# Patient Record
Sex: Female | Born: 1960 | Race: White | Hispanic: No | Marital: Single | Smoking: Never smoker
Health system: Southern US, Community
[De-identification: ages and names within clinical notes are randomized; demographics above are authoritative.]

## PROBLEM LIST (undated history)

## (undated) HISTORY — PX: LOBECTOMY: SHX5089

---

## 1998-10-11 ENCOUNTER — Other Ambulatory Visit: Admission: RE | Admit: 1998-10-11 | Discharge: 1998-10-11 | Payer: Self-pay | Admitting: Obstetrics and Gynecology

## 1998-11-22 ENCOUNTER — Other Ambulatory Visit: Admission: RE | Admit: 1998-11-22 | Discharge: 1998-11-22 | Payer: Self-pay | Admitting: Obstetrics and Gynecology

## 1998-12-26 ENCOUNTER — Other Ambulatory Visit: Admission: RE | Admit: 1998-12-26 | Discharge: 1998-12-26 | Payer: Self-pay | Admitting: Obstetrics and Gynecology

## 1998-12-26 ENCOUNTER — Encounter (INDEPENDENT_AMBULATORY_CARE_PROVIDER_SITE_OTHER): Payer: Self-pay | Admitting: Specialist

## 1999-06-15 ENCOUNTER — Other Ambulatory Visit: Admission: RE | Admit: 1999-06-15 | Discharge: 1999-06-15 | Payer: Self-pay | Admitting: Obstetrics and Gynecology

## 1999-12-11 ENCOUNTER — Ambulatory Visit (HOSPITAL_BASED_OUTPATIENT_CLINIC_OR_DEPARTMENT_OTHER): Admission: RE | Admit: 1999-12-11 | Discharge: 1999-12-11 | Payer: Self-pay | Admitting: Orthopedic Surgery

## 2005-10-21 ENCOUNTER — Other Ambulatory Visit: Admission: RE | Admit: 2005-10-21 | Discharge: 2005-10-21 | Payer: Self-pay | Admitting: Family Medicine

## 2005-10-29 ENCOUNTER — Encounter: Admission: RE | Admit: 2005-10-29 | Discharge: 2005-10-29 | Payer: Self-pay | Admitting: Family Medicine

## 2005-11-12 ENCOUNTER — Encounter: Admission: RE | Admit: 2005-11-12 | Discharge: 2005-11-12 | Payer: Self-pay | Admitting: Family Medicine

## 2006-07-21 ENCOUNTER — Ambulatory Visit (HOSPITAL_COMMUNITY): Admission: RE | Admit: 2006-07-21 | Discharge: 2006-07-21 | Payer: Self-pay | Admitting: Thoracic Surgery

## 2006-08-19 ENCOUNTER — Ambulatory Visit: Payer: Self-pay | Admitting: Thoracic Surgery

## 2006-08-19 ENCOUNTER — Inpatient Hospital Stay (HOSPITAL_COMMUNITY): Admission: RE | Admit: 2006-08-19 | Discharge: 2006-08-22 | Payer: Self-pay | Admitting: Thoracic Surgery

## 2006-08-19 ENCOUNTER — Encounter (INDEPENDENT_AMBULATORY_CARE_PROVIDER_SITE_OTHER): Payer: Self-pay | Admitting: *Deleted

## 2006-08-27 ENCOUNTER — Encounter: Admission: RE | Admit: 2006-08-27 | Discharge: 2006-08-27 | Payer: Self-pay | Admitting: Thoracic Surgery

## 2006-09-16 ENCOUNTER — Ambulatory Visit: Payer: Self-pay | Admitting: Thoracic Surgery

## 2006-09-16 ENCOUNTER — Encounter: Admission: RE | Admit: 2006-09-16 | Discharge: 2006-09-16 | Payer: Self-pay | Admitting: Thoracic Surgery

## 2007-07-25 IMAGING — CR DG CHEST 2V
2 series · 2 of 2 positions shown · non-contrast
Comparison: None.

CLINICAL DATA: Preadmit ? lung mass.
 CHEST ? 2 VIEW:

[view not recorded (1 of 2)]
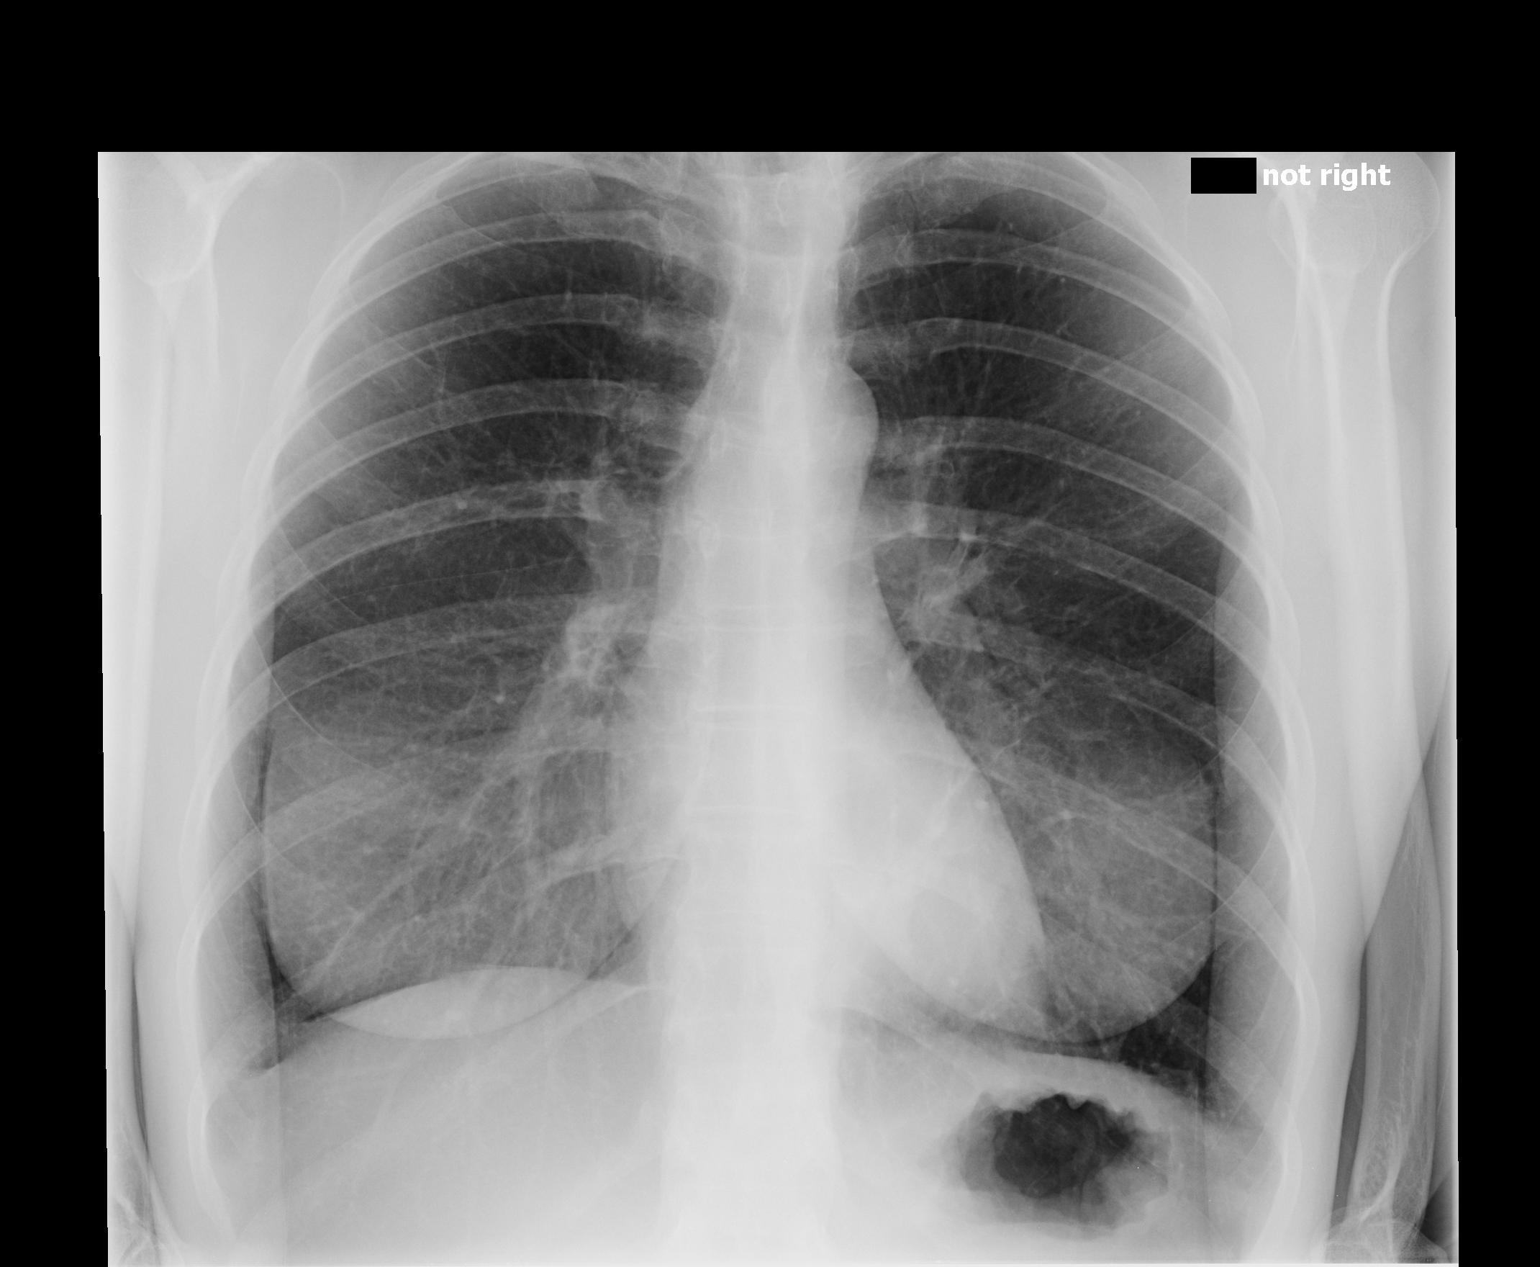

[view not recorded (2 of 2)]
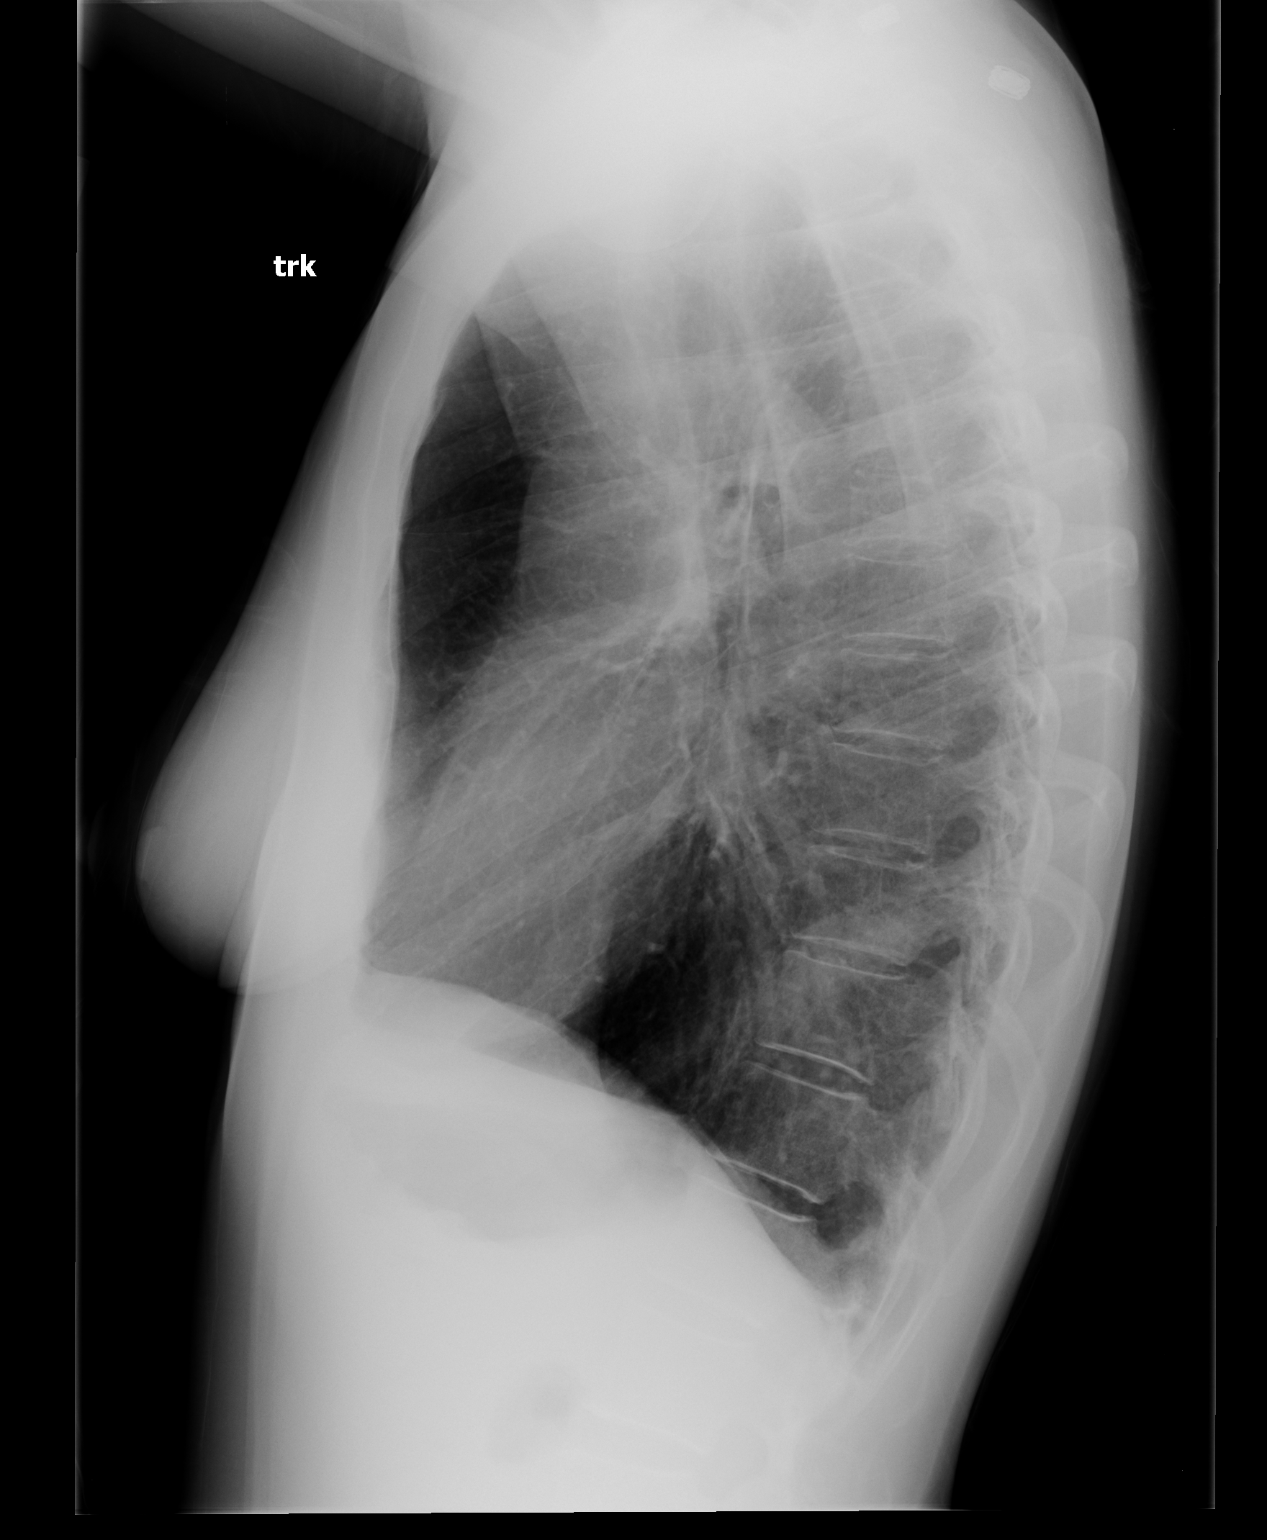

[2 of 2 positions shown; findings below may reference images not displayed]

FINDINGS: Heart size is normal.  The vascularity is normal.  There is no infiltrate or effusion.  Left lower lobe density adjacent to the spine is difficult to see, but is identified on recent PET CT scan.
IMPRESSION: Left lower lobe mass density.  No acute abnormality.

## 2007-08-26 IMAGING — CR DG CHEST 2V
2 series · 2 of 2 positions shown · non-contrast
Comparison: 08/27/06.

CLINICAL DATA: Lung lesion.  History of lung surgery, benign.  Pain on left. 
 CHEST - TWO VIEWS:

[view not recorded (1 of 2)]
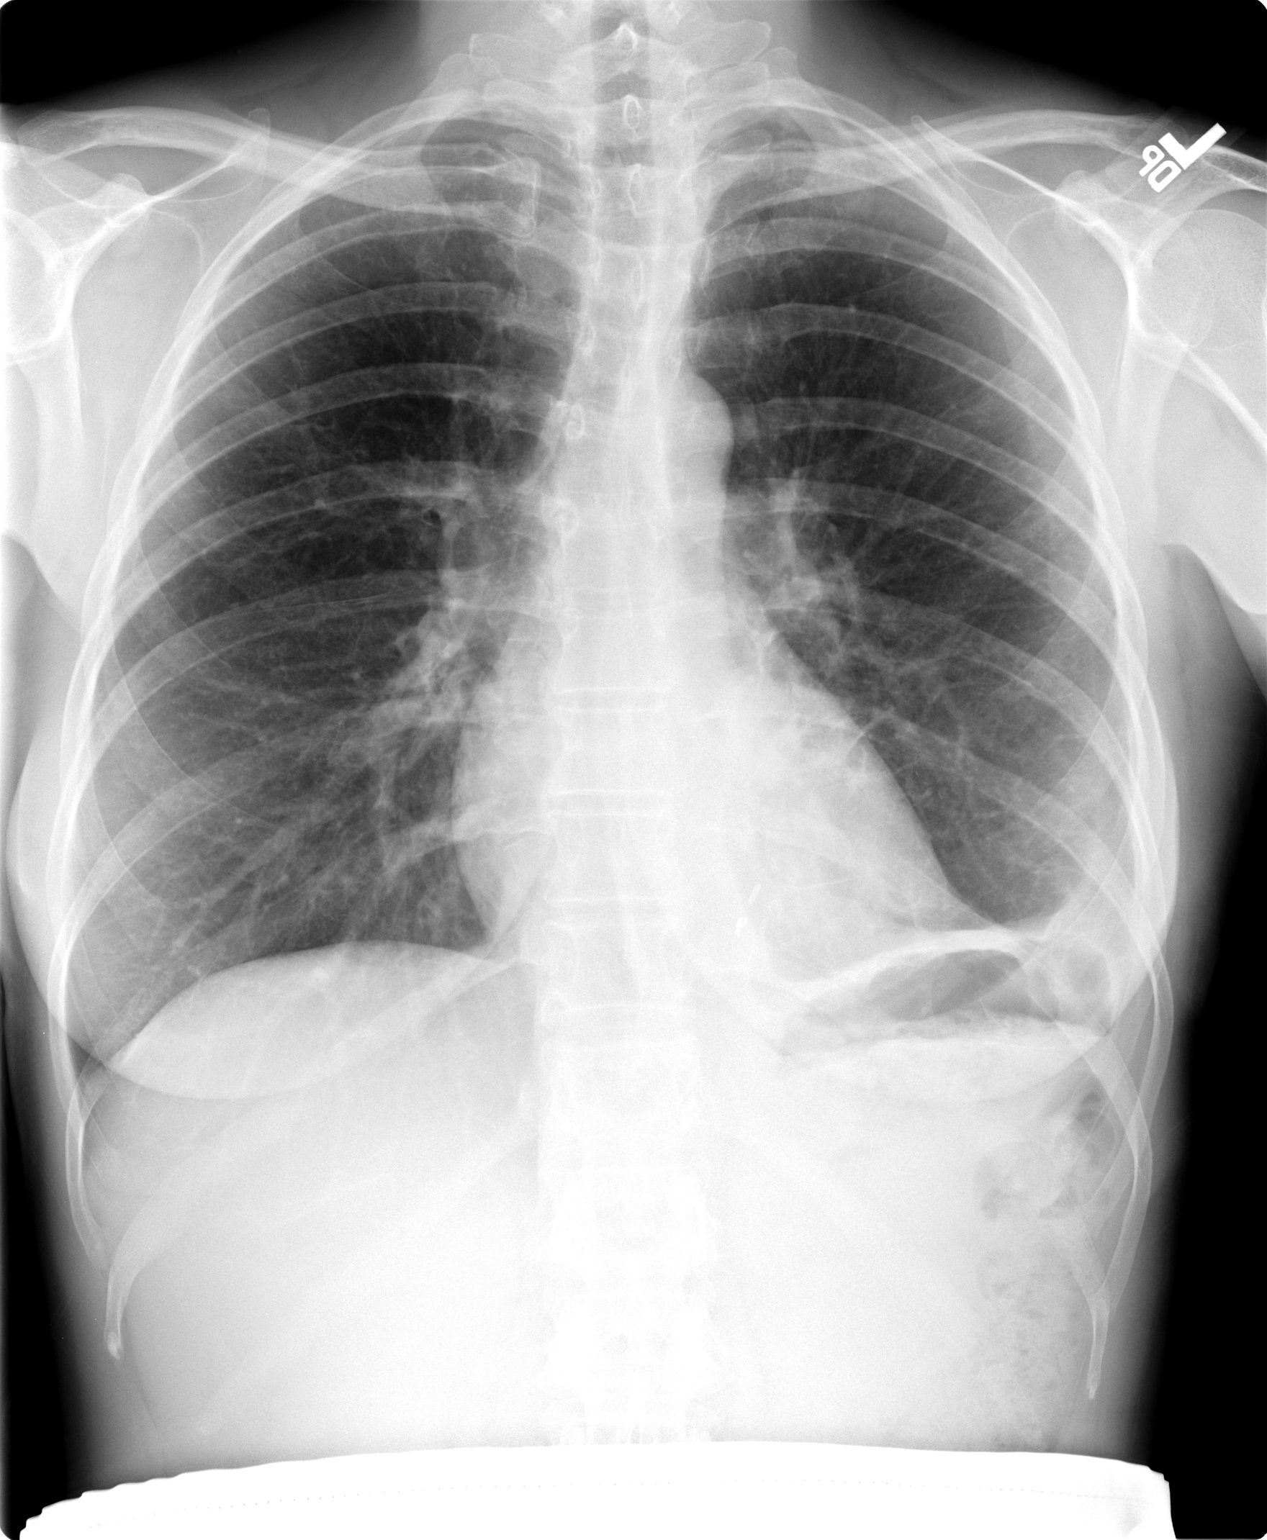

[view not recorded (2 of 2)]
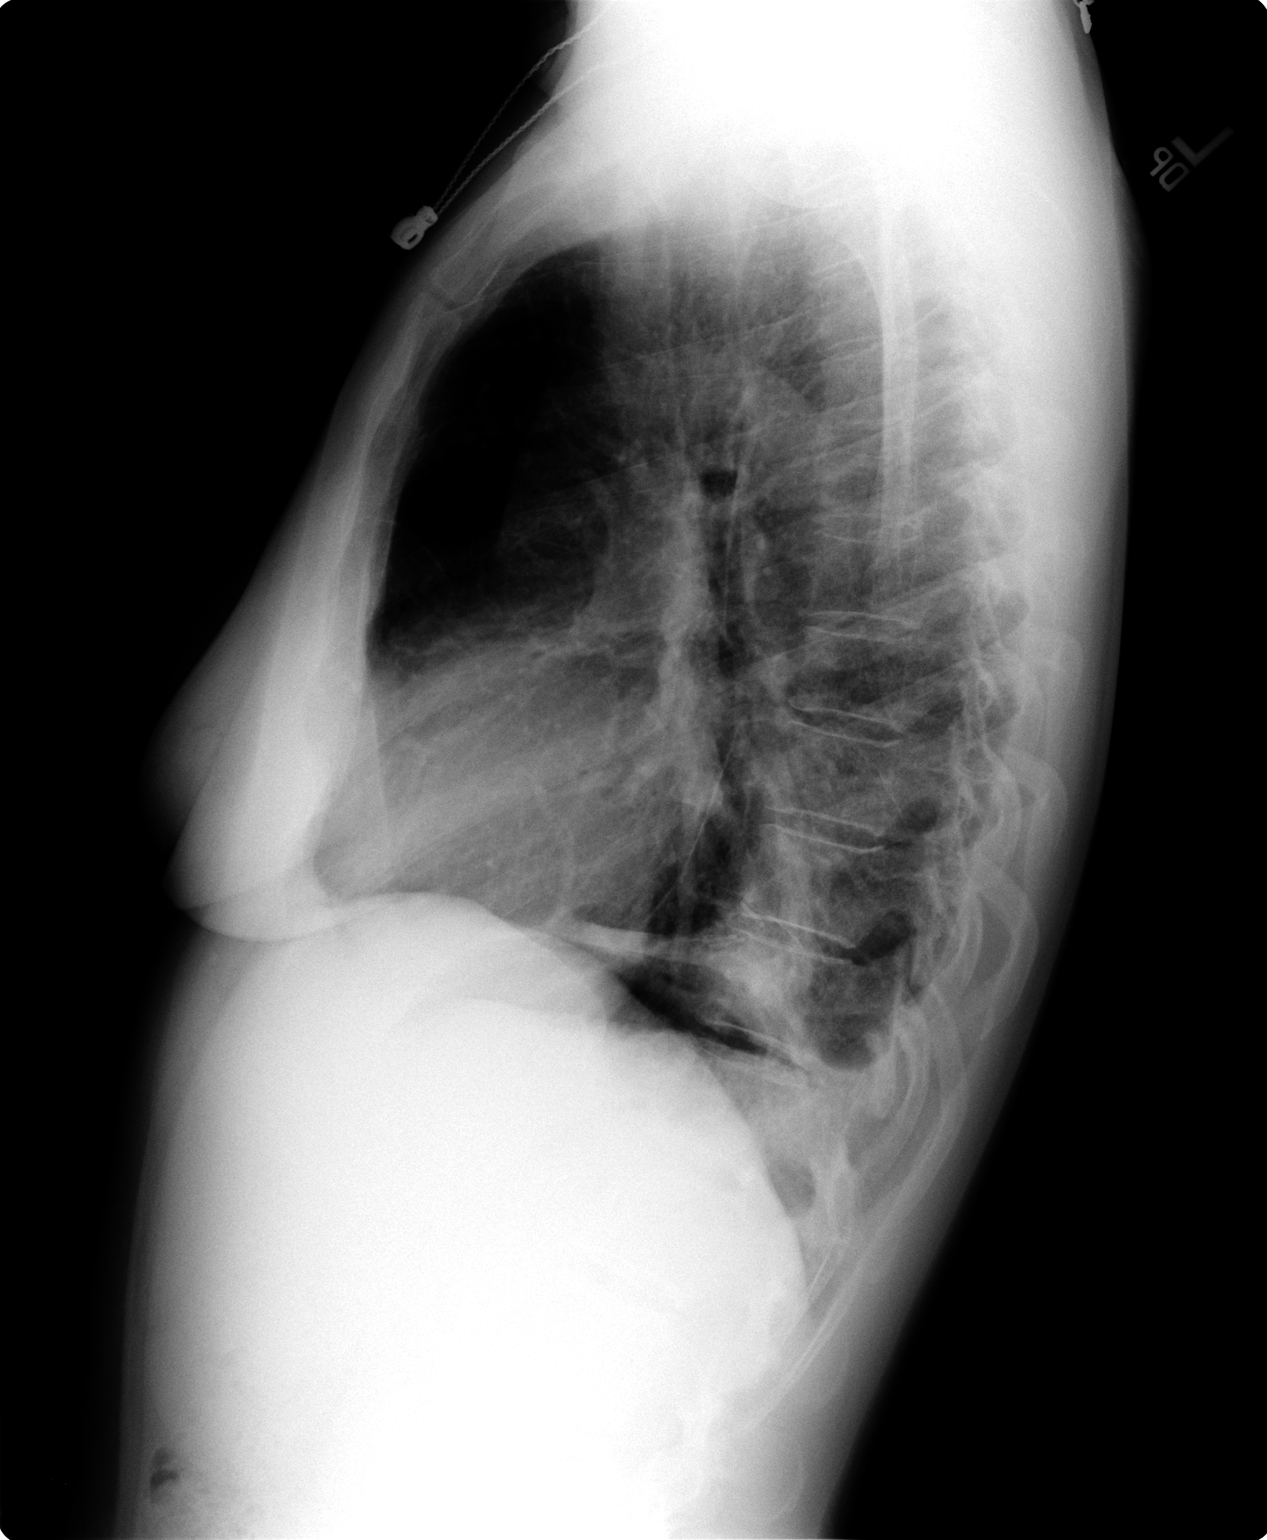

[2 of 2 positions shown; findings below may reference images not displayed]

FINDINGS: Surgical clips are seen at the left lung base.  Left pneumothorax is no longer apparent.  There is left base scar versus atelectasis which appears stable. The right lung is clear.
IMPRESSION: 1.  Postoperative change.
 2.  No evidence for pneumothorax.

## 2010-03-06 ENCOUNTER — Encounter: Admission: RE | Admit: 2010-03-06 | Discharge: 2010-03-06 | Payer: Self-pay | Admitting: Family Medicine

## 2010-07-29 ENCOUNTER — Encounter: Payer: Self-pay | Admitting: Family Medicine

## 2010-07-29 ENCOUNTER — Encounter: Payer: Self-pay | Admitting: Thoracic Surgery

## 2010-11-23 NOTE — H&P (Signed)
Whitney [name], [name] NO.:  [id number]   MEDICAL RECORD NO.:  [id number]          PATIENT TYPE:  INP   LOCATION:  NA                           FACILITY:  [facility]   PHYSICIAN:  Ines Bloomer, M.D. DATE OF BIRTH:  1960-08-08   DATE OF ADMISSION:  DATE OF DISCHARGE:                              HISTORY & PHYSICAL   HISTORY OF PRESENT ILLNESS:  This 50 year old patient had a recent chest  x-ray.  She is a chronic smoker, smoking 1 pack a day and was found to  have a left lower lobe nodule.  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  There  is no history of fever, chills, hemoptysis, or excessive sputum.  Pulmonary function tests showed an FVC of 4.25 with FV1 of 3.29.  PET  scan was done which was negative.   MEDICATIONS:  She is on Adderall 30 mg a day and Lexapro 20 mg a day.   ALLERGIES:  SHE HAS NO ALLERGIES.   PAST MEDICAL HISTORY:  She has been treated for depression in the past.   FAMILY HISTORY:  Positive for cardiac disease.   SOCIAL HISTORY:  She is single and has 2 children.  Is in banking.  Smokes less than a pack a day and does not drink alcohol on a regular  basis.   REVIEW OF SYSTEMS:  GENERAL:  She has had some weight loss.  She is 123  pounds.  She is 5 foot 7.  CARDIAC:  No angina or atrial fibrillation.  PULMONARY:  See history of present illness.  GI:  No nausea, vomiting,  constipation, diarrhea.  GU:  No dysuria or frequent urination.  VASCULAR:  No claudication, DVT, TIAs.  NEUROLOGICAL:  No headaches,  blackouts, seizures.  MUSCULOSKELETAL:  No joint pain.  No muscular  pain.  PSYCHIATRIC:  See past medical history.  HEENT:  No change in her  eyesight or hearing.  HEMATOLOGICAL:  No anemia or problems with  bleeding.   PHYSICAL EXAMINATION:  GENERAL:  She is a well-developed Caucasian  female in no  acute distress.  VITAL SIGNS:  Her blood pressure is 152/90, pulse 98, respirations 18,  sats 98%.  HEAD:  Atraumatic.  Eyes:  Pupils equal, reactive to light and  accommodation.  Extraocular muscles are normal.  Ears:  Tympanic  membranes are intact.  Nose:  No septal deviation.  SKIN:  Without lesions.  NECK:  Supple without thyromegaly.  There is no supraclavicular or  axillary adenopathy.  No carotid bruits.  CHEST:  Clear to auscultation and percussion.  HEART:  Regular sinus rhythm.  No murmurs.  ABDOMEN:  Soft.  There is no hepatosplenomegaly.  Bowel sounds are  normal.  EXTREMITIES:  Pulses are 2+.  There is no clubbing or edema.  NEUROLOGICAL:  She is oriented x3.  Sensory and motor intact.  Cranial  nerves II through XII are intact.  SKIN:  Without lesions.  IMPRESSION:  1. 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  2. History of tobacco abuse.   PLAN:  Left VATS resection of left lower lobe mass.      Ines Bloomer, M.D.  Electronically Signed     DPB/MEDQ  D:  08/16/2006  T:  08/17/2006  Job:  [id number]

## 2010-11-23 NOTE — Discharge Summary (Signed)
Whitney [name], BOLINE NO.:  [id number]   MEDICAL RECORD NO.:  [id number]          PATIENT TYPE:  INP   LOCATION:  3312                         FACILITY:  [facility]   PHYSICIAN:  Ines Bloomer, M.D. DATE OF BIRTH:  1960-12-04   DATE OF ADMISSION:  08/19/2006  DATE OF DISCHARGE:  08/22/2006                               DISCHARGE SUMMARY   HISTORY OF PRESENT ILLNESS:  The patient is a 50 year old female,  referred to Dr. Edwyna Shell after she had a recent chest x-ray and was found  to have a left lower lobe nodule.  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  She had no history of fevers, chills, hemoptysis or  excessive sputum.  Pulmonary function studies were adequate with an FVC  of 4.25 and an FEV1 of 3.29.  PET scan was negative.  Due to this, she  was felt to be a candidate for left video-assisted thoracoscopy with  resection of the left lower lobe mass and ligation of the abrin artery.  She was admitted this hospitalization for the procedure.   PAST MEDICAL HISTORY:  Includes depression in the past.   CURRENT MEDICATIONS:  Lexapro 20 mg daily.   ALLERGIES:  NO KNOWN DRUG ALLERGIES.   FAMILY HISTORY:  Positive for cardiac disease.   SOCIAL HISTORY:  She is single and has 2 children and she works in  Photographer.  She smokes less than a pack a day and does not drink alcohol  on a regular basis.   REVIEW OF SYSTEMS:  Please see the history and physical done at the time  of admission.   PHYSICAL EXAMINATION:  Please see the history and physical done at the  time of admission.   HOSPITAL COURSE:  The patient was admitted electively on August 19, 2006 and taken to the operating room.  At which time, she underwent the  left video-assisted thoracoscopy with resection of the left lower lobe  mass and ligation of abrin  arteries.  The patient tolerated the  procedure well.  Was taken to the post anesthesia care unit in stable  condition.   POSTOPERATIVE HOSPITAL COURSE:  The patient had done well.  She has  remained hemodynamically stable.  She has been weaned from oxygen and  she has maintained good saturations on room air.  Laboratory values are  stable.  She does have a mild postoperative anemia with a hemoglobin and  hematocrit of 11.0 and 33.0 respectively.  Electrolytes BUN and  creatinine are all within normal limits.  Her chest tubes have been  discontinued in the standard fashion.  She does have a small apical  pneumothorax following removal of her chest tube, but this is stable.  She is asymptomatic in regards to this.  She does have moderate pain,  but it is controlled reasonable well with oral analgesics.  She is  tolerating routine activities.  Commenced flow of postoperative  convalescence  using standard protocols.  Her incisions are healing well  without evidence of infection or drainage.  She is overall felt to be  quite stable for discharge on today's day, August 22, 2006.   MEDICATIONS ON DISCHARGE:  Are as follows. Lexapro 30 mg daily, her  preoperative dose.  Her other medications include, Tylox 1 or 2 every 6  hours as needed for pain.   INSTRUCTIONS:  The patient received written instructions in regard to  medications, activity, diet, wound care and followup.   FOLLOWUP:  Will include Dr. Edwyna Shell in 1 week, the office will call with  this arrangement.  Additionally, a chest x-ray will be obtained at that  time.   FINAL DIAGNOSIS:  Left pulmonary sequestration, status post the above-  mentioned procedure.   OTHER DIAGNOSES:  Include:  1. A history of treatment for depression.  2. History of tobacco abuse.  3. Mild postoperative anemia.      Rowe Clack, P.A.-C.      Ines Bloomer, M.D.  Electronically Signed    WEG/MEDQ  D:  08/22/2006  T:  08/23/2006   Job:  [id number]

## 2010-11-23 NOTE — Op Note (Signed)
NAMEREETA, KUK NO.:  [id number]   MEDICAL RECORD NO.:  [id number]          PATIENT TYPE:  INP   LOCATION:  3312                         FACILITY:  [facility]   PHYSICIAN:  Ines Bloomer, M.D. DATE OF BIRTH:  1961/04/05   DATE OF PROCEDURE:  08/19/2006  DATE OF DISCHARGE:                               OPERATIVE REPORT   PREOPERATIVE DIAGNOSIS:  Left lower lobe sequestration, intralobar and  extralobar.   POSTOPERATIVE DIAGNOSIS:  Left lower lobe intralobar sequestration.   SURGEON:  Ines Bloomer, M.D.   FIRST ASSISTANT:  Rowe Clack, P.A.-C.   ANESTHESIA:  General anesthesia.   DESCRIPTION OF PROCEDURE:  After percutaneous insertion of all  monitoring lines, the patient underwent general anesthesia and was  turned to the left lateral thoracotomy position and was prepped and  draped in the usual sterile manner.  Two trocar sites were made in the  anterior and midaxillary line at the seventh and eighth intercostal  space, and the seventh intercostal space was where the anterior trocar  site was.  Two trocars were inserted.  A 0-degree scope was inserted.  I  immediately looked down at the inferior pulmonary ligament, and you  could see the sequestration in the posterior basilar segment of the Luer  lock.  There were a lot of adhesions in this area.  A third incision was  made, a 3 cm to 4-cm incision, over the sixth intercostal space at the  midaxillary line.  The latissimus was partially divided.  The serratus  and intercostal muscles were split.  Through that as our working port,  we took down the adhesions to the left lower lobe with electrocautery,  freeing up the inferior pulmonary ligament.  As we were freeing up the  inferior pulmonary ligament, we identified the 2 aberrant arteries  coming off the aorta.  There was 1 small one and 1 larger one, the  smaller one being 2 to 3 mm, and the larger one being about 6 mm in  diameter.  These were  carefully dissected up.  The smaller one was  doubly clipped and divided.  The larger one was ligated proximally and  distally with 0 silk, clipped and divided.  We then freed the inferior  pulmonary ligament up to the inferior pulmonary vein.  Then, using the  EZ45 stapler coming across the posterior basilar segment sequestration,  this was resected with multiple applications.  After this had been done,  we opened the sequestration and sent the mucus for culture.  On  reexamining the lung, there still was 1 other area that we think was  involved, and this was also resected with the EZ45 stapler with 3  applications.  The lung was reexpanded and expanded well.  There was no  air leak.  She had 2 Blakes brought through the anterior trocar sites  and tied in place.  The posterior trocar site was closed with 3-0  Vicryl.  The single On-Q was placed subpleurally, and an intercostal  nerve block was done with  bupivacaine.  One paracostal  was placed through the small incision, and  drilling through the seventh rib and wrapping around the sixth rib.  And  then the muscle layer was closed with 3-0 Vicryl and 3-0 Vicryl as a  subcuticular stitch.  The lung was reexpanded and the patient returned  to the recovery room in stable condition.      Ines Bloomer, M.D.  Electronically Signed     DPB/MEDQ  D:  08/19/2006  T:  08/19/2006  Job:  [id number]   cc:   Dorita Sciara, MD

## 2011-12-18 ENCOUNTER — Other Ambulatory Visit: Payer: Self-pay | Admitting: Family Medicine

## 2011-12-18 ENCOUNTER — Other Ambulatory Visit (HOSPITAL_COMMUNITY)
Admission: RE | Admit: 2011-12-18 | Discharge: 2011-12-18 | Disposition: A | Discharge status: Home / Self Care | Payer: 59 | Source: Ambulatory Visit | Attending: Family Medicine | Admitting: Family Medicine

## 2011-12-18 DIAGNOSIS — Z1231 Encounter for screening mammogram for malignant neoplasm of breast: Secondary | ICD-10-CM

## 2011-12-18 DIAGNOSIS — Z01419 Encounter for gynecological examination (general) (routine) without abnormal findings: Secondary | ICD-10-CM | POA: Insufficient documentation

## 2012-01-23 ENCOUNTER — Ambulatory Visit: Payer: Self-pay

## 2012-02-06 ENCOUNTER — Other Ambulatory Visit: Payer: Self-pay | Admitting: Family Medicine

## 2012-02-06 DIAGNOSIS — N926 Irregular menstruation, unspecified: Secondary | ICD-10-CM

## 2012-02-12 ENCOUNTER — Ambulatory Visit
Admission: RE | Admit: 2012-02-12 | Discharge: 2012-02-12 | Disposition: A | Discharge status: Home / Self Care | Payer: 59 | Source: Ambulatory Visit | Attending: Family Medicine | Admitting: Family Medicine

## 2012-02-12 DIAGNOSIS — N939 Abnormal uterine and vaginal bleeding, unspecified: Secondary | ICD-10-CM

## 2012-02-19 ENCOUNTER — Other Ambulatory Visit: Payer: Self-pay | Admitting: Family Medicine

## 2012-02-19 DIAGNOSIS — R140 Abdominal distension (gaseous): Secondary | ICD-10-CM

## 2012-02-19 DIAGNOSIS — R1010 Upper abdominal pain, unspecified: Secondary | ICD-10-CM

## 2012-02-19 DIAGNOSIS — R110 Nausea: Secondary | ICD-10-CM

## 2012-02-20 ENCOUNTER — Ambulatory Visit
Admission: RE | Admit: 2012-02-20 | Discharge: 2012-02-20 | Disposition: A | Discharge status: Home / Self Care | Payer: 59 | Source: Ambulatory Visit | Attending: Family Medicine | Admitting: Family Medicine

## 2012-02-20 DIAGNOSIS — R140 Abdominal distension (gaseous): Secondary | ICD-10-CM

## 2012-02-20 DIAGNOSIS — R1010 Upper abdominal pain, unspecified: Secondary | ICD-10-CM

## 2012-02-20 DIAGNOSIS — R110 Nausea: Secondary | ICD-10-CM

## 2012-02-28 ENCOUNTER — Other Ambulatory Visit: Payer: Self-pay | Admitting: Gastroenterology

## 2012-04-03 ENCOUNTER — Other Ambulatory Visit: Payer: Self-pay | Admitting: Family Medicine

## 2012-04-03 DIAGNOSIS — N83201 Unspecified ovarian cyst, right side: Secondary | ICD-10-CM

## 2012-04-22 ENCOUNTER — Other Ambulatory Visit: Payer: 59

## 2012-11-04 ENCOUNTER — Other Ambulatory Visit: Payer: Self-pay | Admitting: Dermatology

## 2012-12-23 ENCOUNTER — Other Ambulatory Visit: Payer: Self-pay | Admitting: Family Medicine

## 2012-12-23 DIAGNOSIS — E049 Nontoxic goiter, unspecified: Secondary | ICD-10-CM

## 2012-12-23 DIAGNOSIS — N938 Other specified abnormal uterine and vaginal bleeding: Secondary | ICD-10-CM

## 2012-12-30 ENCOUNTER — Other Ambulatory Visit: Payer: 59

## 2013-01-06 ENCOUNTER — Other Ambulatory Visit: Payer: 59

## 2013-02-08 ENCOUNTER — Encounter: Payer: 59 | Attending: Family Medicine | Admitting: Dietician

## 2013-02-08 ENCOUNTER — Encounter: Payer: Self-pay | Admitting: Dietician

## 2013-02-08 VITALS — Ht 67.0 in | Wt 157.3 lb

## 2013-02-08 DIAGNOSIS — E162 Hypoglycemia, unspecified: Secondary | ICD-10-CM | POA: Insufficient documentation

## 2013-02-08 DIAGNOSIS — Z713 Dietary counseling and surveillance: Secondary | ICD-10-CM | POA: Insufficient documentation

## 2013-02-08 NOTE — Patient Instructions (Addendum)
Eat 3 meals per day and 2-3 snacks with carbohydrates and protein. Eat every 3-5 hours you are awake.  Bring portion controlled snacks to work (measure out at home). Add vegetables and/or protein to lunch and dinner.  Consider finding time at work or on the weekend to go for walks.

## 2013-02-08 NOTE — Progress Notes (Signed)
Medical Nutrition Therapy:  Appt start time: 1630 end time:  1730.   Assessment:  Primary concerns today: Doctor recommend that Vessie talk to a dietitian since she has been having low blood sugar 2-3 times per week for about 6-9 months. She tested her blood sugar with her husband's meter about a month ago and her blood sugar was 48. Elyanna stated that her doctor ruled out medical reasons for her low blood sugar. Blood sugar tends to go low around 10:30 AM and she has symptoms such as sweating and a pounding heart, though she doesn't feel hungry. When this happens she eats some crackers and feels better.   Fatoumata works for [employer] and lives by herself so she does her grocery shopping and doesn't cook much. She is married but lives apart from her husband. She eats out  about 5 meals out per week, and typically orders grilled chicken salad.   Ahliya's current weigh is her heaviest, and she was very distressed by seeing the number on the scale, which was 157.3 lbs. Her BMI is 24.6. She stopped drinking in October 2008 and she weighed 122 lbs at that time, which she states was too thin. Mee stated that her eating hasn't changed since then but she is gradually gaining weight. Katyra said that she eats food quickly and is prone to eating a lot of foods such as crackers or cereal from the box and not keep track of how much she is eating. She has an hour commute now and is not engaging in physical activity. She would like to weigh 130-135 lbs.    MEDICATIONS: See List   DIETARY INTAKE:  24-hr recall:  B ( 5 AM): coffee with sugar free creamer   Snk ( 8 AM): dry cereal (honey bunches of oats), sometimes bacon  L ( PM): leftover nachos with hamburger, salsa, beans, or salad with lettuce, cucumbers, tomatoes with water Snk ( PM): none D ( PM): salads, pizza, steak, salad, and baked potato Snk ( PM): Eddy[employer] frozen fruit bar (80 calories) Beverages: drinks water, flavored water, or diet coke  through the day  Usual physical activity: none  Estimated energy needs: 1800 calories 200 g carbohydrates 135 g protein 50 g fat  Progress Towards Goal(s):  In progress.   Nutritional Diagnosis:  NB-1.1 Food and nutrition-related knowledge deficit As related to inconsistent intake of carbohydrates and unstructured meals.  As evidenced by frequent hypoglycemia and diet recall.    Intervention:  Nutrition counseling provided. Discussed eating small frequent meals/snacks with adequate CHO and protein starting with first thing in the morning to avoid large swings in blood sugar. Discussed the importance of keeping the body fueled steadily with carbohydrates and sustained with protein and fat. Recommended portioned out snack foods at home to bring to work in order to avoid mindless eating from a large box. Also recommended portioning dinner out in the kitchen instead of having serving dishes at the table. Jadamarie Butson to add physical activity such as walking on weekends and/or during the day at work to make it part of her routine.   Plan: Eat 3 meals per day and 2-3 snacks with carbohydrates and protein. Eat every 3-5 hours you are awake.  Bring portion controlled snacks to work (measure out at home). Add vegetables and/or protein to lunch and dinner.  Consider finding time at work or on the weekend to go for walks.   Handouts given during visit include:  MyPlate handout  40J  CHO snacks  1800 meal plan from eatright.org  Monitoring/Evaluation:  Dietary intake, exercise, and body weight prn.

## 2014-08-24 ENCOUNTER — Other Ambulatory Visit (HOSPITAL_COMMUNITY)
Admission: RE | Admit: 2014-08-24 | Discharge: 2014-08-24 | Disposition: A | Discharge status: Home / Self Care | Payer: 59 | Source: Ambulatory Visit | Attending: Family Medicine | Admitting: Family Medicine

## 2014-08-24 ENCOUNTER — Other Ambulatory Visit: Payer: Self-pay | Admitting: Family Medicine

## 2014-08-24 DIAGNOSIS — Z1151 Encounter for screening for human papillomavirus (HPV): Secondary | ICD-10-CM | POA: Diagnosis present

## 2014-08-24 DIAGNOSIS — Z124 Encounter for screening for malignant neoplasm of cervix: Secondary | ICD-10-CM | POA: Diagnosis not present

## 2014-08-25 LAB — CYTOLOGY - PAP

## 2019-03-25 ENCOUNTER — Ambulatory Visit: Payer: 59 | Admitting: Internal Medicine

## 2021-06-06 LAB — HM COLONOSCOPY

## 2021-06-07 HISTORY — PX: RECTAL PROLAPSE REPAIR: SHX759

## 2023-01-16 ENCOUNTER — Ambulatory Visit: Payer: Self-pay | Admitting: Nurse Practitioner

## 2023-01-29 ENCOUNTER — Ambulatory Visit (INDEPENDENT_AMBULATORY_CARE_PROVIDER_SITE_OTHER): Payer: 59 | Admitting: Nurse Practitioner

## 2023-01-29 ENCOUNTER — Encounter: Payer: Self-pay | Admitting: Nurse Practitioner

## 2023-01-29 VITALS — BP 105/60 | HR 67 | Temp 97.0°F | Ht 67.0 in | Wt 136.0 lb

## 2023-01-29 DIAGNOSIS — Z0000 Encounter for general adult medical examination without abnormal findings: Secondary | ICD-10-CM

## 2023-01-29 DIAGNOSIS — N3281 Overactive bladder: Secondary | ICD-10-CM | POA: Diagnosis not present

## 2023-01-29 DIAGNOSIS — Z87891 Personal history of nicotine dependence: Secondary | ICD-10-CM | POA: Diagnosis not present

## 2023-01-29 DIAGNOSIS — E559 Vitamin D deficiency, unspecified: Secondary | ICD-10-CM | POA: Diagnosis not present

## 2023-01-29 DIAGNOSIS — Z0001 Encounter for general adult medical examination with abnormal findings: Secondary | ICD-10-CM | POA: Diagnosis not present

## 2023-01-29 DIAGNOSIS — F331 Major depressive disorder, recurrent, moderate: Secondary | ICD-10-CM | POA: Diagnosis not present

## 2023-01-29 LAB — LIPID PANEL

## 2023-01-29 LAB — THYROID PANEL WITH TSH

## 2023-01-29 LAB — CMP14+EGFR

## 2023-01-29 LAB — CBC WITH DIFFERENTIAL/PLATELET
EOS (ABSOLUTE): 0.4 10*3/uL (ref 0.0–0.4)
Eos: 5 %
Hemoglobin: 12.7 g/dL (ref 11.1–15.9)
Immature Grans (Abs): 0 10*3/uL (ref 0.0–0.1)
Lymphocytes Absolute: 3.8 10*3/uL — ABNORMAL HIGH (ref 0.7–3.1)
Lymphs: 46 %
MCV: 92 fL (ref 79–97)
RBC: 4.12 x10E6/uL (ref 3.77–5.28)
WBC: 8.3 10*3/uL (ref 3.4–10.8)

## 2023-01-29 LAB — BAYER DCA HB A1C WAIVED: HB A1C (BAYER DCA - WAIVED): 5.1 % (ref 4.8–5.6)

## 2023-01-29 MED ORDER — OXYBUTYNIN CHLORIDE ER 10 MG PO TB24
10.0000 mg | ORAL_TABLET | Freq: Every day | ORAL | 0 refills | Status: DC
Start: 2023-01-29 — End: 2023-05-08

## 2023-01-29 NOTE — Progress Notes (Signed)
New Patient Office Visit  Subjective    Patient ID: Whitney [name], female    DOB: 16-Sep-1960  Age: 62 y.o. MRN: [id number]  CC:  Chief Complaint  Patient presents with   Establish Care   Wrist Injury    Pt said she was playing with dog 2 weeks and got tangled up and hurt wrist.     HPI Whitney [name] is a 62 yrs old female presents to establish care.PMH MDD, OAB, low Vit-D,  MDD Major Depressive Disorder (MDD) isn't responding adequately to 40 mg of Celexa (citalopram). Reports many failed medication Prozac, Lexapro, Wellbutrin, Zoloft. She was in counseling and reports that it wasn't for her. " I don't have any desire to do anything". Reports anhedonia, lack of energy. Denies SI/HI. She is recovenirng alcolic, an dsober for 15 yrs and has been atetnding [employer].   Low D Complaints of persistent fatigue, generalized muscle weakness, and frequent bone pain over the past several months. Symptoms have gradually worsened despite adequate rest and a balanced diet. reports spending minimal time outdoors and using sunscreen regularly due to concerns about skin cancer, limiting sun exposure. There is no history of malabsorption syndromes or significant dietary restrictions. Family history is negative for bone disorders or autoimmune diseases. She ws Vit- D supplement and stopped taking them.   Past history of smoking quit 12 yrs ago, dies low dose Ct scan  Denies family history of HTN, DM Denies family of breast, cervical and ovarain, colorrectal cancer   Outpatient Encounter Medications as of 01/29/2023  Medication Sig   b complex vitamins tablet Take 1 tablet by mouth daily.   citalopram (CELEXA) 40 MG tablet Take 40 mg by mouth daily.   oxybutynin (DITROPAN-XL) 10 MG 24 hr tablet Take 1 tablet (10 mg total) by mouth daily.   [DISCONTINUED] buPROPion (ZYBAN) 150 MG 12 hr tablet Take 150 mg by mouth 2 (two) times daily.   [DISCONTINUED] Cholecalciferol (VITAMIN D-3) 5000 UNITS TABS Take by  mouth.   [DISCONTINUED] FLUoxetine (PROZAC) 40 MG capsule Take 40 mg by mouth daily.   [DISCONTINUED] oxybutynin (DITROPAN-XL) 10 MG 24 hr tablet Take 10 mg by mouth daily.   No facility-administered encounter medications on file as of 01/29/2023.    History reviewed. No pertinent past medical history.  Past Surgical History:  Procedure Laterality Date   LOBECTOMY     RECTAL PROLAPSE REPAIR  06/07/2021    Family History  Problem Relation Age of Onset   Cancer Other    COPD Other    Hypertension Other     Social History   Socioeconomic History   Marital status: Single    Spouse name: Not on file   Number of children: Not on file   Years of education: Not on file   Highest education level: Not on file  Occupational History   Not on file  Tobacco Use   Smoking status: Never   Smokeless tobacco: Not on file  Vaping Use   Vaping status: Never Used  Substance and Sexual Activity   Alcohol use: Never   Drug use: Not on file   Sexual activity: Not on file  Other Topics Concern   Not on file  Social History Narrative   Not on file   Social Determinants of Health   Financial Resource Strain: Low Risk  (04/09/2021)   Received from [facility]   Overall Financial Resource Strain (CARDIA)    Difficulty of Paying Living Expenses: Not  hard at all  Food Insecurity: No Food Insecurity (02/11/2022)   Received from [facility]   Hunger Vital Sign    Worried About Running Out of Food in the Last Year: Never true    Ran Out of Food in the Last Year: Never true  Transportation Needs: No Transportation Needs (04/09/2021)   Received from [facility] - Transportation    Lack of Transportation (Medical): No    Lack of Transportation (Non-Medical): No  Physical Activity: Insufficiently Active (04/09/2021)   Received from [facility]   Exercise Vital Sign    Days of Exercise per Week: 2 days    Minutes of Exercise per Session: 30 min  Stress: No Stress Concern  Present (06/06/2021)   Received from [facility] - Occupational Stress Questionnaire    Feeling of Stress : Not at all  Social Connections: Socially Integrated (03/17/2022)   Received from [facility]   Social Network    How would you rate your social network (family, work, friends)?: Good participation with social networks  Intimate Partner Violence: Not At Risk (03/17/2022)   Received from [facility]   HITS    Over the last 12 months how often did your partner physically hurt you?: 1    Over the last 12 months how often did your partner insult you or talk down to you?: 1    Over the last 12 months how often did your partner threaten you with physical harm?: 1    Over the last 12 months how often did your partner scream or curse at you?: 1    Review of Systems  Constitutional:  Positive for malaise/fatigue. Negative for chills and fever.  Eyes:  Negative for blurred vision and pain.  Gastrointestinal:  Negative for diarrhea, melena, nausea and vomiting.  Genitourinary:  Negative for urgency.  Musculoskeletal:  Negative for falls and myalgias.  Skin:  Negative for itching and rash.  Neurological:  Negative for dizziness and weakness.  Endo/Heme/Allergies:  Negative for polydipsia. Does not bruise/bleed easily.   Negative unless indicated in HPI   Objective    BP 105/60   Pulse 67   Temp (!) 97 F (36.1 C) (Temporal)   Ht 5\' 7"  (1.702 m)   Wt 136 lb (61.7 kg)   SpO2 97%   BMI 21.30 kg/m   Physical Exam Vitals and nursing note reviewed.  Constitutional:      General: She is not in acute distress.    Appearance: Normal appearance.  HENT:     Head: Normocephalic and atraumatic.  Eyes:     General: No scleral icterus.    Extraocular Movements: Extraocular movements intact.     Conjunctiva/sclera: Conjunctivae normal.     Pupils: Pupils are equal, round, and reactive to light.  Neck:     Vascular: No carotid bruit.   Cardiovascular:     Rate and Rhythm: Normal rate and regular rhythm.  Pulmonary:     Effort: Pulmonary effort is normal.     Breath sounds: Normal breath sounds. No wheezing.  Abdominal:     General: Bowel sounds are normal. There is no distension.     Palpations: Abdomen is soft. There is no mass.     Tenderness: There is no guarding.  Musculoskeletal:        General: Normal range of motion.     Cervical back: Normal range of motion and neck supple. No rigidity.  Right lower leg: No edema.     Left lower leg: No edema.  Skin:    General: Skin is warm and dry.     Coloration: Skin is not jaundiced.     Findings: No rash.  Neurological:     General: No focal deficit present.     Mental Status: She is alert and oriented to person, place, and time. Mental status is at baseline.  Psychiatric:        Attention and Perception: Attention and perception normal.        Mood and Affect: Mood is depressed.        Speech: Speech normal.        Behavior: Behavior normal. Behavior is cooperative.        Thought Content: Thought content does not include homicidal or suicidal ideation. Thought content does not include homicidal or suicidal plan.        Cognition and Memory: Cognition and memory normal.        Judgment: Judgment normal.     Assessment & Plan:  Routine medical exam -     CBC with Differential/Platelet -     CMP14+EGFR -     Lipid panel -     Thyroid Panel With TSH -     Bayer DCA Hb A1c Waived -     VITAMIN D 25 Hydroxy (Vit-D Deficiency, Fractures)  Vitamin D deficiency -     VITAMIN D 25 Hydroxy (Vit-D Deficiency, Fractures)  Overactive bladder -     oxyBUTYnin Chloride ER; Take 1 tablet (10 mg total) by mouth daily.  Dispense: 30 tablet; Refill: 0  Former smoker, stopped smoking in distant past  Kamaria Lucia is a 32 yrs Caucasian female, no acute distress MDD: continue Chelex, order genesight test Refuse counseling, was not helpful in the past Recheck Vit-D  before adjusting Celexa dose Labs: Vit-d, lipid, TSH, CBC, CMP, A1c Genesight test d/t multiple medications failure Plan of care discussed with client all questions answered  Return in about 6 weeks (around 03/12/2023) for mental health and Vit D.   Arrie Aran Santa Lighter, DNP [facility] [address] [place], [place] [address] [phone]

## 2023-01-30 LAB — CBC WITH DIFFERENTIAL/PLATELET
Basophils Absolute: 0.1 10*3/uL (ref 0.0–0.2)
Basos: 1 %
Hematocrit: 37.9 % (ref 34.0–46.6)
Immature Granulocytes: 0 %
MCH: 30.8 pg (ref 26.6–33.0)
MCHC: 33.5 g/dL (ref 31.5–35.7)
Monocytes Absolute: 0.9 10*3/uL (ref 0.1–0.9)
Monocytes: 11 %
Neutrophils Absolute: 3.1 10*3/uL (ref 1.4–7.0)
Neutrophils: 37 %
Platelets: 407 10*3/uL (ref 150–450)
RDW: 12.6 % (ref 11.7–15.4)

## 2023-01-30 LAB — CMP14+EGFR
AST: 28 IU/L (ref 0–40)
Albumin: 4.2 g/dL (ref 3.9–4.9)
Alkaline Phosphatase: 74 IU/L (ref 44–121)
BUN/Creatinine Ratio: 17 (ref 12–28)
CO2: 25 mmol/L (ref 20–29)
Calcium: 9.7 mg/dL (ref 8.7–10.3)
Globulin, Total: 2.7 g/dL (ref 1.5–4.5)
Glucose: 83 mg/dL (ref 70–99)
Potassium: 5.1 mmol/L (ref 3.5–5.2)
Sodium: 138 mmol/L (ref 134–144)
Total Protein: 6.9 g/dL (ref 6.0–8.5)
eGFR: 102 mL/min/{1.73_m2} (ref >59)

## 2023-01-30 LAB — VITAMIN D 25 HYDROXY (VIT D DEFICIENCY, FRACTURES): Vit D, 25-Hydroxy: 57.6 ng/mL (ref 30.0–100.0)

## 2023-01-30 LAB — LIPID PANEL
HDL: 65 mg/dL (ref >39)
LDL Chol Calc (NIH): 116 mg/dL — ABNORMAL HIGH (ref 0–99)

## 2023-01-30 LAB — THYROID PANEL WITH TSH: Free Thyroxine Index: 2 (ref 1.2–4.9)

## 2023-02-17 ENCOUNTER — Encounter: Payer: Self-pay | Admitting: Nurse Practitioner

## 2023-02-17 ENCOUNTER — Ambulatory Visit (INDEPENDENT_AMBULATORY_CARE_PROVIDER_SITE_OTHER): Payer: 59 | Admitting: Nurse Practitioner

## 2023-02-17 VITALS — BP 111/77 | HR 69 | Temp 98.5°F | Ht 67.0 in | Wt 138.0 lb

## 2023-02-17 DIAGNOSIS — F331 Major depressive disorder, recurrent, moderate: Secondary | ICD-10-CM

## 2023-02-17 MED ORDER — CITALOPRAM HYDROBROMIDE 20 MG PO TABS: 20.0000 mg | ORAL_TABLET | Freq: Every day | ORAL | 3 refills | Status: DC

## 2023-02-17 NOTE — Progress Notes (Signed)
Established Patient Office Visit  Subjective   Patient ID: Whitney [name], female    DOB: [birth year]  Age: 62 y.o. 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 She refused counseling. She has been on Celexa for over 5 yrs and reports that it no longer working for her. In the past se was treated with Lexapro and Wellbutrin which was great. According to the gene result, that both will great with her. We discuss weaning he ron Celexa to be able to introduce the new medications. She understands that the weaning processing will be challenging and agrees.Denies SI/HI. 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    Patient Active Problem List   Diagnosis Date Noted   Moderate episode of recurrent major depressive disorder (HCC) 02/17/2023   Routine medical exam 01/29/2023   Vitamin D deficiency 01/29/2023   Overactive bladder 01/29/2023   Former smoker, stopped smoking in distant past 01/29/2023   History reviewed. No pertinent past medical history. Past Surgical History:  Procedure Laterality Date   LOBECTOMY     RECTAL PROLAPSE REPAIR  06/07/2021   Social History   Tobacco Use   Smoking status: Never  Vaping Use   Vaping status: Never Used  Substance Use Topics   Alcohol use: Never   Social History   Socioeconomic History   Marital status: Single    Spouse name: Not on file   Number of children: Not on file   Years of education: Not on file   Highest education level: 12th grade  Occupational History   Not on file  Tobacco Use   Smoking status: Never   Smokeless tobacco: Not on file  Vaping Use   Vaping status: Never Used  Substance and Sexual Activity   Alcohol use: Never    Drug use: Not on file   Sexual activity: Not on file  Other Topics Concern   Not on file  Social History Narrative   Not on file   Social Determinants of Health   Financial Resource Strain: Low Risk  (02/15/2023)   Overall Financial Resource Strain (CARDIA)    Difficulty of Paying Living Expenses: Not hard at all  Food Insecurity: No Food Insecurity (02/15/2023)   Hunger Vital Sign    Worried About Running Out of Food in the Last Year: Never true    Ran Out of Food in the Last Year: Never true  Transportation Needs: No Transportation Needs (02/15/2023)   PRAPARE - Administrator, Civil Service (Medical): No    Lack of Transportation (Non-Medical): No  Physical Activity: Unknown (02/15/2023)   Exercise Vital Sign    Days of Exercise per Week: Patient declined    Minutes of Exercise per Session: Not on file  Stress: Stress Concern Present (02/15/2023)   [employer] of Occupational Health - Occupational Stress Questionnaire    Feeling of Stress : Rather much  Social Connections: Moderately Isolated (02/15/2023)   Social Connection and Isolation Panel [NHANES]    Frequency of Communication with Friends and Family: More than three times a week    Frequency of Social Gatherings with Friends and Family: Twice a week  Attends Religious Services: Never    Active Member of Clubs or Organizations: No    Attends Banker Meetings: Not on file    Marital Status: Living with partner  Intimate Partner Violence: Not At Risk (03/17/2022)   Received from [facility]   HITS    Over the last 12 months how often did your partner physically hurt you?: 1    Over the last 12 months how often did your partner insult you or talk down to you?: 1    Over the last 12 months how often did your partner threaten you with physical harm?: 1    Over the last 12 months how often did your partner scream or curse at you?: 1   Family Status  Relation Name Status   Other  (Not  Specified)  No partnership data on file   Family History  Problem Relation Age of Onset   Cancer Other    COPD Other    Hypertension Other    No Known Allergies      02/17/2023   12:39 PM 01/29/2023    9:18 AM  PHQ9 SCORE ONLY  PHQ-9 Total Score 20 14    Review of Systems  Constitutional:  Negative for chills and fever.  Respiratory:  Negative for cough and shortness of breath.   Cardiovascular:  Negative for chest pain and leg swelling.  Gastrointestinal:  Negative for blood in stool, melena, nausea and vomiting.  Musculoskeletal:  Negative for back pain and myalgias.  Skin:  Negative for itching and rash.  Neurological:  Negative for dizziness and headaches.  Psychiatric/Behavioral:  Positive for depression. Negative for hallucinations, substance abuse and suicidal ideas. The patient does not have insomnia.    Negative unless indicated in HPI   Objective:     BP 111/77   Pulse 69   Temp 98.5 F (36.9 C)   Ht 5\' 7"  (1.702 m)   Wt 138 lb (62.6 kg)   SpO2 93%   BMI 21.61 kg/m  BP Readings from Last 3 Encounters:  02/17/23 111/77  01/29/23 105/60   Wt Readings from Last 3 Encounters:  02/17/23 138 lb (62.6 kg)  01/29/23 136 lb (61.7 kg)  02/08/13 157 lb 4.8 oz (71.4 kg)      Physical Exam Constitutional:      Appearance: Normal appearance. She is normal weight.  Eyes:     General: No scleral icterus.    Extraocular Movements: Extraocular movements intact.     Conjunctiva/sclera: Conjunctivae normal.     Pupils: Pupils are equal, round, and reactive to light.  Cardiovascular:     Rate and Rhythm: Normal rate and regular rhythm.  Pulmonary:     Effort: Pulmonary effort is normal.     Breath sounds: Normal breath sounds.  Musculoskeletal:        General: Normal range of motion.     Right lower leg: No edema.     Left lower leg: No edema.  Skin:    General: Skin is warm and dry.     Coloration: Skin is not jaundiced.  Neurological:     General: No  focal deficit present.     Mental Status: She is alert and oriented to person, place, and time.  Psychiatric:        Attention and Perception: Attention and perception normal.        Mood and Affect: Affect normal. Mood is depressed.        Speech: Speech normal.  Behavior: Behavior normal. Behavior is cooperative.        Thought Content: Thought content normal. Thought content does not include homicidal or suicidal ideation. Thought content does not include homicidal or suicidal plan.        Cognition and Memory: Cognition and memory normal.        Judgment: Judgment normal.      No results found for any visits on 02/17/23.  Last CBC Lab Results  Component Value Date   WBC 8.3 01/29/2023   HGB 12.7 01/29/2023   HCT 37.9 01/29/2023   MCV 92 01/29/2023   MCH 30.8 01/29/2023   RDW 12.6 01/29/2023   PLT 407 01/29/2023   Last metabolic panel Lab Results  Component Value Date   GLUCOSE 83 01/29/2023   NA 138 01/29/2023   K 5.1 01/29/2023   CL 101 01/29/2023   CO2 25 01/29/2023   BUN 10 01/29/2023   CREATININE 0.58 01/29/2023   EGFR 102 01/29/2023   CALCIUM 9.7 01/29/2023   PROT 6.9 01/29/2023   ALBUMIN 4.2 01/29/2023   LABGLOB 2.7 01/29/2023   BILITOT 0.3 01/29/2023   ALKPHOS 74 01/29/2023   AST 28 01/29/2023   ALT 33 (H) 01/29/2023   Last lipids Lab Results  Component Value Date   CHOL 197 01/29/2023   HDL 65 01/29/2023   LDLCALC 116 (H) 01/29/2023   TRIG 87 01/29/2023   CHOLHDL 3.0 01/29/2023   Last hemoglobin A1c Lab Results  Component Value Date   HGBA1C 5.1 01/29/2023   Last thyroid functions Lab Results  Component Value Date   TSH 0.743 01/29/2023   T4TOTAL 8.1 01/29/2023   Last vitamin D Lab Results  Component Value Date   VD25OH 57.6 01/29/2023        Assessment & Plan:  Moderate episode of recurrent major depressive disorder (HCC) -     Citalopram Hydrobromide; Take 1 tablet (20 mg total) by mouth daily. For 3 weeks  Dispense: 30  tablet; Refill: 3   Kloey was seen for MDD and Genesight test results Wean her off Celexa within the next months  Decrease Celexa to 20 mg daily for 3-weeks  before starting Lexapro and Wellbutrin Refused counseling Return in about 3 weeks (around 03/10/2023).    Arrie Aran Santa Lighter, DNP [facility] [address] [place], [place] [address] [phone]

## 2023-02-20 ENCOUNTER — Other Ambulatory Visit (HOSPITAL_COMMUNITY): Payer: Self-pay | Admitting: Nurse Practitioner

## 2023-02-20 DIAGNOSIS — Z1231 Encounter for screening mammogram for malignant neoplasm of breast: Secondary | ICD-10-CM

## 2023-02-24 ENCOUNTER — Inpatient Hospital Stay
Admission: RE | Admit: 2023-02-24 | Discharge: 2023-02-24 | Disposition: A | Discharge status: Home / Self Care | Payer: Self-pay | Source: Ambulatory Visit | Attending: Nurse Practitioner | Admitting: Nurse Practitioner

## 2023-02-24 ENCOUNTER — Other Ambulatory Visit (HOSPITAL_COMMUNITY): Payer: Self-pay | Admitting: Nurse Practitioner

## 2023-02-24 DIAGNOSIS — Z1231 Encounter for screening mammogram for malignant neoplasm of breast: Secondary | ICD-10-CM

## 2023-02-26 ENCOUNTER — Ambulatory Visit (HOSPITAL_COMMUNITY)
Admission: RE | Admit: 2023-02-26 | Discharge: 2023-02-26 | Disposition: A | Discharge status: Home / Self Care | Payer: 59 | Source: Ambulatory Visit | Attending: Nurse Practitioner | Admitting: Nurse Practitioner

## 2023-02-26 DIAGNOSIS — Z1231 Encounter for screening mammogram for malignant neoplasm of breast: Secondary | ICD-10-CM | POA: Insufficient documentation

## 2023-03-07 DIAGNOSIS — F331 Major depressive disorder, recurrent, moderate: Secondary | ICD-10-CM

## 2023-03-07 MED ORDER — ESCITALOPRAM OXALATE 10 MG PO TABS
10.0000 mg | ORAL_TABLET | Freq: Every day | ORAL | 0 refills | Status: AC
Start: 2023-03-07 — End: ?

## 2023-03-07 NOTE — Telephone Encounter (Signed)
Spoke to pt . Done.

## 2023-03-08 NOTE — Progress Notes (Unsigned)
Established Patient Office Visit  Subjective   Patient ID: Whitney [name], female    DOB: 1961/06/05  Age: 62 y.o. MRN: [id number]  No chief complaint on file.   HPI  Patient Active Problem List   Diagnosis Date Noted   Moderate episode of recurrent major depressive disorder (HCC) 02/17/2023   Routine medical exam 01/29/2023   Vitamin D deficiency 01/29/2023   Overactive bladder 01/29/2023   Former smoker, stopped smoking in distant past 01/29/2023   No past medical history on file. Past Surgical History:  Procedure Laterality Date   LOBECTOMY     RECTAL PROLAPSE REPAIR  06/07/2021   Social History   Tobacco Use   Smoking status: Never  Vaping Use   Vaping status: Never Used  Substance Use Topics   Alcohol use: Never   Social History   Socioeconomic History   Marital status: Single    Spouse name: Not on file   Number of children: Not on file   Years of education: Not on file   Highest education level: 12th grade  Occupational History   Not on file  Tobacco Use   Smoking status: Never   Smokeless tobacco: Not on file  Vaping Use   Vaping status: Never Used  Substance and Sexual Activity   Alcohol use: Never   Drug use: Not on file   Sexual activity: Not on file  Other Topics Concern   Not on file  Social History Narrative   Not on file   Social Determinants of Health   Financial Resource Strain: Low Risk  (02/15/2023)   Overall Financial Resource Strain (CARDIA)    Difficulty of Paying Living Expenses: Not hard at all  Food Insecurity: No Food Insecurity (02/15/2023)   Hunger Vital Sign    Worried About Running Out of Food in the Last Year: Never true    Ran Out of Food in the Last Year: Never true  Transportation Needs: No Transportation Needs (02/15/2023)   PRAPARE - Administrator, Civil Service (Medical): No    Lack of Transportation (Non-Medical): No  Physical Activity: Unknown (02/15/2023)   Exercise Vital Sign    Days of Exercise  per Week: Patient declined    Minutes of Exercise per Session: Not on file  Stress: Stress Concern Present (02/15/2023)   [employer] of Occupational Health - Occupational Stress Questionnaire    Feeling of Stress : Rather much  Social Connections: Moderately Isolated (02/15/2023)   Social Connection and Isolation Panel [NHANES]    Frequency of Communication with Friends and Family: More than three times a week    Frequency of Social Gatherings with Friends and Family: Twice a week    Attends Religious Services: Never    Database administrator or Organizations: No    Attends Engineer, structural: Not on file    Marital Status: Living with partner  Intimate Partner Violence: Not At Risk (03/17/2022)   Received from [facility]   HITS    Over the last 12 months how often did your partner physically hurt you?: 1    Over the last 12 months how often did your partner insult you or talk down to you?: 1    Over the last 12 months how often did your partner threaten you with physical harm?: 1    Over the last 12 months how often did your partner scream or curse at you?: 1   Family Status  Relation Name Status  Other  (Not Specified)  No partnership data on file   Family History  Problem Relation Age of Onset   Cancer Other    COPD Other    Hypertension Other    No Known Allergies    ROS Negative unless indicated in HPI   Objective:     There were no vitals taken for this visit. BP Readings from Last 3 Encounters:  02/17/23 111/77  01/29/23 105/60   Wt Readings from Last 3 Encounters:  02/17/23 138 lb (62.6 kg)  01/29/23 136 lb (61.7 kg)  02/08/13 157 lb 4.8 oz (71.4 kg)      Physical Exam   No results found for any visits on 03/12/23.  Last CBC Lab Results  Component Value Date   WBC 8.3 01/29/2023   HGB 12.7 01/29/2023   HCT 37.9 01/29/2023   MCV 92 01/29/2023   MCH 30.8 01/29/2023   RDW 12.6 01/29/2023   PLT 407 01/29/2023   Last metabolic  panel Lab Results  Component Value Date   GLUCOSE 83 01/29/2023   NA 138 01/29/2023   K 5.1 01/29/2023   CL 101 01/29/2023   CO2 25 01/29/2023   BUN 10 01/29/2023   CREATININE 0.58 01/29/2023   EGFR 102 01/29/2023   CALCIUM 9.7 01/29/2023   PROT 6.9 01/29/2023   ALBUMIN 4.2 01/29/2023   LABGLOB 2.7 01/29/2023   BILITOT 0.3 01/29/2023   ALKPHOS 74 01/29/2023   AST 28 01/29/2023   ALT 33 (H) 01/29/2023   Last lipids Lab Results  Component Value Date   CHOL 197 01/29/2023   HDL 65 01/29/2023   LDLCALC 116 (H) 01/29/2023   TRIG 87 01/29/2023   CHOLHDL 3.0 01/29/2023        Assessment & Plan:  There are no diagnoses linked to this encounter.   Continue healthy lifestyle choices, including diet (rich in fruits, vegetables, and lean proteins, and low in salt and simple carbohydrates) and exercise (at least 30 minutes of moderate physical activity daily).     The above assessment and management plan was discussed with the patient. The patient verbalized understanding of and has agreed to the management plan. Patient is aware to call the clinic if they develop any new symptoms or if symptoms persist or worsen. Patient is aware when to return to the clinic for a follow-up visit. Patient educated on when it is appropriate to go to the emergency department.  No follow-ups on file.    Arrie Aran Santa Lighter, DNP [facility] [address] [place], [place] [address] [phone]

## 2023-03-12 ENCOUNTER — Ambulatory Visit (INDEPENDENT_AMBULATORY_CARE_PROVIDER_SITE_OTHER): Payer: 59 | Admitting: Nurse Practitioner

## 2023-03-12 ENCOUNTER — Encounter: Payer: Self-pay | Admitting: Nurse Practitioner

## 2023-03-12 VITALS — BP 99/60 | HR 60 | Temp 97.6°F | Ht 67.0 in | Wt 139.6 lb

## 2023-03-12 DIAGNOSIS — F331 Major depressive disorder, recurrent, moderate: Secondary | ICD-10-CM

## 2023-03-12 MED ORDER — ESCITALOPRAM OXALATE 10 MG PO TABS
10.0000 mg | ORAL_TABLET | Freq: Every day | ORAL | 0 refills | Status: AC
Start: 2023-03-12 — End: ?

## 2023-03-12 MED ORDER — BUSPIRONE HCL 5 MG PO TABS
5.0000 mg | ORAL_TABLET | Freq: Two times a day (BID) | ORAL | 0 refills | Status: AC
Start: 2023-03-12 — End: ?

## 2023-03-13 ENCOUNTER — Other Ambulatory Visit: Payer: Self-pay | Admitting: Oncology

## 2023-03-13 DIAGNOSIS — Z006 Encounter for examination for normal comparison and control in clinical research program: Secondary | ICD-10-CM

## 2023-04-02 ENCOUNTER — Telehealth: Payer: Self-pay

## 2023-04-02 NOTE — Telephone Encounter (Signed)
Called and spoke with pt as requested in her mychart message. Pt stated her bottle only says quantity 30 0 refills. Informed pt that the prescription was called in for 90 day supply. Pt stated her insurance will only cover 30 at a time. Informed pt to call her pharmacy to see where the rest of her medication went since they did not call us to change the prescription when it was filled and that if the pharmacy needs a new prescription for them to call us

## 2023-04-04 ENCOUNTER — Other Ambulatory Visit (HOSPITAL_COMMUNITY): Payer: Self-pay

## 2023-04-04 ENCOUNTER — Other Ambulatory Visit: Payer: Self-pay | Admitting: *Deleted

## 2023-04-04 DIAGNOSIS — F331 Major depressive disorder, recurrent, moderate: Secondary | ICD-10-CM

## 2023-04-04 MED ORDER — ESCITALOPRAM OXALATE 10 MG PO TABS
10.0000 mg | ORAL_TABLET | Freq: Every day | ORAL | 0 refills | Status: DC
Start: 2023-04-04 — End: 2023-05-14
  Filled 2023-04-04: qty 30, 30d supply, fill #0
  Filled 2023-04-30: qty 90, 90d supply, fill #0

## 2023-04-07 ENCOUNTER — Other Ambulatory Visit: Payer: Self-pay | Admitting: Nurse Practitioner

## 2023-04-07 DIAGNOSIS — F331 Major depressive disorder, recurrent, moderate: Secondary | ICD-10-CM

## 2023-04-14 ENCOUNTER — Encounter: Payer: Self-pay | Admitting: Nurse Practitioner

## 2023-04-14 ENCOUNTER — Ambulatory Visit (INDEPENDENT_AMBULATORY_CARE_PROVIDER_SITE_OTHER): Payer: 59 | Admitting: Nurse Practitioner

## 2023-04-14 VITALS — BP 115/75 | HR 61 | Temp 97.5°F | Ht 67.0 in | Wt 140.0 lb

## 2023-04-14 DIAGNOSIS — F331 Major depressive disorder, recurrent, moderate: Secondary | ICD-10-CM

## 2023-04-14 DIAGNOSIS — R4586 Emotional lability: Secondary | ICD-10-CM | POA: Insufficient documentation

## 2023-04-14 MED ORDER — RISPERIDONE 0.25 MG PO TABS
0.2500 mg | ORAL_TABLET | Freq: Every day | ORAL | 0 refills | Status: DC
Start: 2023-04-14 — End: 2023-05-01

## 2023-04-14 MED ORDER — BUSPIRONE HCL 10 MG PO TABS
10.0000 mg | ORAL_TABLET | Freq: Two times a day (BID) | ORAL | 0 refills | Status: DC
Start: 2023-04-14 — End: 2023-05-14

## 2023-04-14 NOTE — Progress Notes (Signed)
Established Patient Office Visit  Subjective   Patient ID: Whitney [name], female    DOB: Aug 28, 1960  Age: 62 y.o. MRN: [id number]  Chief Complaint  Patient presents with   Medical Management of Chronic Issues    1 month   HPI  A 62 year old female with a history of Major Depressive Disorder (MDD) presents for follow-up regarding her current treatment regimen. She has been on Lexapro (escitalopram) 10 mg daily and Buspar (buspirone) 5 mg twice daily. The patient reports that her mood has been relatively stable but has plateaued, with persistent symptoms of anhedonia and frequent mood swings.  She notes that despite adherence to her current medications, she continues to experience a lack of interest in activities she previously enjoyed and describes her mood as fluctuating, often feeling irritable and unmotivated. The patient denies any suicidal ideation or self-harm behaviors but expresses frustration with her current state.  Given her ongoing symptoms, the plan is to increase the Buspar dosage to 10 mg twice daily and to initiate Risperdal (risperidone) at bedtime to help manage her mood swings. The patient was educated about potential side effects and the importance of follow-up to assess the effectiveness of these adjustments. She has no significant medical history or current health concerns aside from her MDD, and she denies any recent life stressors or changes in her routine that might contribute to her mood fluctuations.  Patient Active Problem List   Diagnosis Date Noted   Mood swings 04/14/2023   Moderate episode of recurrent major depressive disorder (HCC) 02/17/2023   Routine medical exam 01/29/2023   Vitamin D deficiency 01/29/2023   Overactive bladder 01/29/2023   Former smoker, stopped smoking in distant past 01/29/2023   History reviewed. No pertinent past medical history. Past Surgical History:  Procedure Laterality Date   LOBECTOMY     RECTAL PROLAPSE REPAIR  06/07/2021    Social History   Tobacco Use   Smoking status: Never  Vaping Use   Vaping status: Never Used  Substance Use Topics   Alcohol use: Never   Social History   Socioeconomic History   Marital status: Single    Spouse name: Not on file   Number of children: Not on file   Years of education: Not on file   Highest education level: 12th grade  Occupational History   Not on file  Tobacco Use   Smoking status: Never   Smokeless tobacco: Not on file  Vaping Use   Vaping status: Never Used  Substance and Sexual Activity   Alcohol use: Never   Drug use: Not on file   Sexual activity: Not on file  Other Topics Concern   Not on file  Social History Narrative   Not on file   Social Determinants of Health   Financial Resource Strain: Low Risk  (02/15/2023)   Overall Financial Resource Strain (CARDIA)    Difficulty of Paying Living Expenses: Not hard at all  Food Insecurity: No Food Insecurity (02/15/2023)   Hunger Vital Sign    Worried About Running Out of Food in the Last Year: Never true    Ran Out of Food in the Last Year: Never true  Transportation Needs: No Transportation Needs (02/15/2023)   PRAPARE - Administrator, Civil Service (Medical): No    Lack of Transportation (Non-Medical): No  Physical Activity: Unknown (02/15/2023)   Exercise Vital Sign    Days of Exercise per Week: Patient declined    Minutes of Exercise per  Session: Not on file  Stress: Stress Concern Present (02/15/2023)   [employer] of Occupational Health - Occupational Stress Questionnaire    Feeling of Stress : Rather much  Social Connections: Unknown (03/19/2023)   Received from [facility]   Social Network    Social Network: Not on file  Recent Concern: Social Connections - Moderately Isolated (02/15/2023)   Social Connection and Isolation Panel [NHANES]    Frequency of Communication with Friends and Family: More than three times a week    Frequency of Social Gatherings with  Friends and Family: Twice a week    Attends Religious Services: Never    Database administrator or Organizations: No    Attends Engineer, structural: Not on file    Marital Status: Living with partner  Intimate Partner Violence: Unknown (03/19/2023)   Received from [facility]   HITS    Physically Hurt: Not on file    Insult or Talk Down To: Not on file    Threaten Physical Harm: Not on file    Scream or Curse: Not on file   Family Status  Relation Name Status   Other  (Not Specified)  No partnership data on file   Family History  Problem Relation Age of Onset   Cancer Other    COPD Other    Hypertension Other    No Known Allergies    Review of Systems  Constitutional:  Negative for chills and fever.  HENT:  Negative for congestion and sore throat.   Eyes:  Negative for double vision.  Respiratory:  Negative for cough and shortness of breath.   Gastrointestinal:  Negative for heartburn and nausea.  Genitourinary:  Negative for frequency, hematuria and urgency.  Musculoskeletal:  Negative for falls and myalgias.  Skin:  Negative for itching and rash.  Neurological:  Negative for dizziness and headaches.  Endo/Heme/Allergies:  Negative for environmental allergies and polydipsia. Does not bruise/bleed easily.  Psychiatric/Behavioral:  Positive for depression. Negative for hallucinations. The patient is nervous/anxious.    Negative unless indicated in HPI   Objective:     BP 115/75   Pulse 61   Temp (!) 97.5 F (36.4 C) (Temporal)   Ht 5\' 7"  (1.702 m)   Wt 140 lb (63.5 kg)   SpO2 96%   BMI 21.93 kg/m  BP Readings from Last 3 Encounters:  04/14/23 115/75  03/12/23 99/60  02/17/23 111/77   Wt Readings from Last 3 Encounters:  04/14/23 140 lb (63.5 kg)  03/12/23 139 lb 9.6 oz (63.3 kg)  02/17/23 138 lb (62.6 kg)      Physical Exam Vitals and nursing note reviewed.  Constitutional:      Appearance: Normal appearance.  HENT:     Head:  Normocephalic and atraumatic.  Eyes:     General: No scleral icterus.    Extraocular Movements: Extraocular movements intact.     Conjunctiva/sclera: Conjunctivae normal.     Pupils: Pupils are equal, round, and reactive to light.  Cardiovascular:     Rate and Rhythm: Normal rate and regular rhythm.  Pulmonary:     Effort: Pulmonary effort is normal.     Breath sounds: Normal breath sounds.  Musculoskeletal:        General: Normal range of motion.     Cervical back: Normal range of motion and neck supple.     Right lower leg: No edema.     Left lower leg: No edema.  Skin:  General: Skin is warm and dry.     Findings: No rash.  Neurological:     Mental Status: She is alert and oriented to person, place, and time. Mental status is at baseline.  Psychiatric:        Attention and Perception: Attention and perception normal.        Mood and Affect: Affect is angry.        Speech: Speech normal.        Behavior: Behavior normal. Behavior is cooperative.        Thought Content: Thought content normal. Thought content does not include homicidal or suicidal ideation. Thought content does not include homicidal or suicidal plan.        Cognition and Memory: Cognition and memory normal.        Judgment: Judgment normal.     No results found for any visits on 04/14/23.  Last CBC Lab Results  Component Value Date   WBC 8.3 01/29/2023   HGB 12.7 01/29/2023   HCT 37.9 01/29/2023   MCV 92 01/29/2023   MCH 30.8 01/29/2023   RDW 12.6 01/29/2023   PLT 407 01/29/2023   Last metabolic panel Lab Results  Component Value Date   GLUCOSE 83 01/29/2023   NA 138 01/29/2023   K 5.1 01/29/2023   CL 101 01/29/2023   CO2 25 01/29/2023   BUN 10 01/29/2023   CREATININE 0.58 01/29/2023   EGFR 102 01/29/2023   CALCIUM 9.7 01/29/2023   PROT 6.9 01/29/2023   ALBUMIN 4.2 01/29/2023   LABGLOB 2.7 01/29/2023   BILITOT 0.3 01/29/2023   ALKPHOS 74 01/29/2023   AST 28 01/29/2023   ALT 33 (H)  01/29/2023   Last lipids Lab Results  Component Value Date   CHOL 197 01/29/2023   HDL 65 01/29/2023   LDLCALC 116 (H) 01/29/2023   TRIG 87 01/29/2023   CHOLHDL 3.0 01/29/2023   Last hemoglobin A1c Lab Results  Component Value Date   HGBA1C 5.1 01/29/2023   Last thyroid functions Lab Results  Component Value Date   TSH 0.743 01/29/2023   T4TOTAL 8.1 01/29/2023   Last vitamin D Lab Results  Component Value Date   VD25OH 57.6 01/29/2023   Last vitamin B12 and Folate No results found for: "VITAMINB12", "FOLATE"      Assessment & Plan:  Moderate episode of recurrent major depressive disorder (HCC) -     busPIRone HCl; Take 1 tablet (10 mg total) by mouth 2 (two) times daily.  Dispense: 180 tablet; Refill: 0  Mood swings -     risperiDONE; Take 1 tablet (0.25 mg total) by mouth at bedtime.  Dispense: 30 tablet; Refill: 0  Zuleika is a 62 year old Caucasian female, no acute distress Mood swings will start risperidone 0.25 mg at bedtime MDD start GAD will increase BuSpar to 10 mg twice a day and continue Lexapro 10 mg daily Client is still refusing counseling DEXA scan ordered today No labs needed  Encourage healthy lifestyle choices, including diet (rich in fruits, vegetables, and lean proteins, and low in salt and simple carbohydrates) and exercise (at least 30 minutes of moderate physical activity daily).     The above assessment and management plan was discussed with the patient. The patient verbalized understanding of and has agreed to the management plan. Patient is aware to call the clinic if they develop any new symptoms or if symptoms persist or worsen. Patient is aware when to return to the clinic for a follow-up visit.  Patient educated on when it is appropriate to go to the emergency department.  Return in about 4 weeks (around 05/12/2023) for mental health med dose adjustment.    Arrie Aran Santa Lighter, DNP [facility] [address] [place], [place] [address] [phone]

## 2023-04-30 ENCOUNTER — Other Ambulatory Visit: Payer: Self-pay

## 2023-05-01 ENCOUNTER — Other Ambulatory Visit: Payer: Self-pay | Admitting: Nurse Practitioner

## 2023-05-01 DIAGNOSIS — R4586 Emotional lability: Secondary | ICD-10-CM

## 2023-05-08 ENCOUNTER — Other Ambulatory Visit: Payer: Self-pay | Admitting: Nurse Practitioner

## 2023-05-08 DIAGNOSIS — N3281 Overactive bladder: Secondary | ICD-10-CM

## 2023-05-13 ENCOUNTER — Ambulatory Visit: Payer: 59 | Admitting: Nurse Practitioner

## 2023-05-14 ENCOUNTER — Encounter: Payer: Self-pay | Admitting: Nurse Practitioner

## 2023-05-14 ENCOUNTER — Telehealth (INDEPENDENT_AMBULATORY_CARE_PROVIDER_SITE_OTHER): Payer: 59 | Admitting: Nurse Practitioner

## 2023-05-14 DIAGNOSIS — F331 Major depressive disorder, recurrent, moderate: Secondary | ICD-10-CM | POA: Diagnosis not present

## 2023-05-14 DIAGNOSIS — R4586 Emotional lability: Secondary | ICD-10-CM | POA: Diagnosis not present

## 2023-05-14 MED ORDER — RISPERIDONE 0.25 MG PO TABS
0.2500 mg | ORAL_TABLET | Freq: Every day | ORAL | 0 refills | Status: DC
Start: 2023-05-14 — End: 2023-06-11

## 2023-05-14 MED ORDER — ESCITALOPRAM OXALATE 10 MG PO TABS
10.0000 mg | ORAL_TABLET | Freq: Every day | ORAL | 0 refills | Status: DC
Start: 2023-05-14 — End: 2023-06-26

## 2023-05-14 MED ORDER — BUSPIRONE HCL 10 MG PO TABS
10.0000 mg | ORAL_TABLET | Freq: Two times a day (BID) | ORAL | 0 refills | Status: DC
Start: 2023-05-14 — End: 2023-06-11

## 2023-05-14 NOTE — Progress Notes (Signed)
Virtual Visit via video Note Due to COVID-19 pandemic this visit was conducted virtually. This visit type was conducted due to national recommendations for restrictions regarding the COVID-19 Pandemic (e.g. social distancing, sheltering in place) in an effort to limit this patient's exposure and mitigate transmission in our community. All issues noted in this document were discussed and addressed.  A physical exam was not performed with this format.   I connected with Whitney [name] on 05/14/2023 at 1245 by name and DOB and verified that I am speaking with the correct person using two identifiers. Whitney [name] is currently located at Home and  alone  is currently with them during visit. The provider, Martina Sinner, DNP is located in their office at time of visit.  I discussed the limitations, risks, security and privacy concerns of performing an evaluation and management service by virtual visit and the availability of in person appointments. I also discussed with the patient that there may be a patient responsible charge related to this service. The patient expressed understanding and agreed to proceed.  Subjective:  Patient ID: Whitney [name], female    DOB: Aug 05, 1960, 62 y.o.   MRN: [id number]  Chief Complaint:  Depression   HPI: Whitney [name] is a 62 y.o. female presenting on 05/14/2023 for Depression  Whitney [name] has an active diagnosis of major depression/dysthymia last seen 18-month sago at that visit Buspar was increased and Risperdal started. Reports mood has improved with the changes and experiencing more good days than bad one " I feel like the medications dosage are perfect and I am feeling better". Mood has improved, and sleeping better.        04/14/2023   10:36 AM 03/12/2023    8:58 AM 02/17/2023   12:39 PM  PHQ9 SCORE ONLY  PHQ-9 Total Score 14 16 20         Relevant past medical, surgical, family, and social history reviewed and updated as indicated.  Allergies and  medications reviewed and updated.   History reviewed. No pertinent past medical history.  Past Surgical History:  Procedure Laterality Date   LOBECTOMY     RECTAL PROLAPSE REPAIR  06/07/2021    Social History   Socioeconomic History   Marital status: Single    Spouse name: Not on file   Number of children: Not on file   Years of education: Not on file   Highest education level: 12th grade  Occupational History   Not on file  Tobacco Use   Smoking status: Never   Smokeless tobacco: Not on file  Vaping Use   Vaping status: Never Used  Substance and Sexual Activity   Alcohol use: Never   Drug use: Not on file   Sexual activity: Not on file  Other Topics Concern   Not on file  Social History Narrative   Not on file   Social Determinants of Health   Financial Resource Strain: Low Risk  (02/15/2023)   Overall Financial Resource Strain (CARDIA)    Difficulty of Paying Living Expenses: Not hard at all  Food Insecurity: No Food Insecurity (05/10/2023)   Hunger Vital Sign    Worried About Running Out of Food in the Last Year: Never true    Ran Out of Food in the Last Year: Never true  Transportation Needs: No Transportation Needs (05/10/2023)   PRAPARE - Administrator, Civil Service (Medical): No    Lack of Transportation (Non-Medical): No  Physical  Activity: Unknown (05/10/2023)   Exercise Vital Sign    Days of Exercise per Week: Patient declined    Minutes of Exercise per Session: Not on file  Stress: Stress Concern Present (05/10/2023)   [employer] of Occupational Health - Occupational Stress Questionnaire    Feeling of Stress : To some extent  Social Connections: Moderately Isolated (05/10/2023)   Social Connection and Isolation Panel [NHANES]    Frequency of Communication with Friends and Family: More than three times a week    Frequency of Social Gatherings with Friends and Family: Three times a week    Attends Religious Services: Never    Active  Member of Clubs or Organizations: No    Attends Banker Meetings: Not on file    Marital Status: Living with partner  Intimate Partner Violence: Unknown (03/19/2023)   Received from [facility]   HITS    Physically Hurt: Not on file    Insult or Talk Down To: Not on file    Threaten Physical Harm: Not on file    Scream or Curse: Not on file    Outpatient Encounter Medications as of 05/14/2023  Medication Sig   busPIRone (BUSPAR) 10 MG tablet Take 1 tablet (10 mg total) by mouth 2 (two) times daily.   escitalopram (LEXAPRO) 10 MG tablet Take 1 tablet (10 mg total) by mouth daily. STOP celexa.   oxybutynin (DITROPAN-XL) 10 MG 24 hr tablet TAKE 1 TABLET BY MOUTH EVERY DAY   risperiDONE (RISPERDAL) 0.25 MG tablet Take 1 tablet (0.25 mg total) by mouth at bedtime.   [DISCONTINUED] busPIRone (BUSPAR) 10 MG tablet Take 1 tablet (10 mg total) by mouth 2 (two) times daily.   [DISCONTINUED] escitalopram (LEXAPRO) 10 MG tablet Take 1 tablet (10 mg total) by mouth daily. STOP celexa.   [DISCONTINUED] risperiDONE (RISPERDAL) 0.25 MG tablet TAKE 1 TABLET BY MOUTH AT BEDTIME.   No facility-administered encounter medications on file as of 05/14/2023.    No Known Allergies  Review of Systems  Constitutional:  Negative for activity change, appetite change, chills, fatigue and unexpected weight change.  Respiratory:  Negative for shortness of breath and wheezing.   Cardiovascular:  Negative for chest pain, palpitations and leg swelling.  Gastrointestinal:  Negative for blood in stool, nausea and vomiting.  Genitourinary:  Negative for urgency.  Skin: Negative.  Negative for rash.  Psychiatric/Behavioral:  Negative for agitation, hallucinations, sleep disturbance and suicidal ideas. The patient is not nervous/anxious.     Observations/Objective: No vital signs or physical exam, this was a virtual health encounter.  Pt alert and oriented, answers all questions appropriately, and able to  speak in full sentences.    Assessment and Plan: Whitney [name] was seen today for depression.  Diagnoses and all orders for this visit:  Mood swings -     risperiDONE (RISPERDAL) 0.25 MG tablet; Take 1 tablet (0.25 mg total) by mouth at bedtime.  Moderate episode of recurrent major depressive disorder (HCC) -     busPIRone (BUSPAR) 10 MG tablet; Take 1 tablet (10 mg total) by mouth 2 (two) times daily. -     escitalopram (LEXAPRO) 10 MG tablet; Take 1 tablet (10 mg total) by mouth daily. STOP celexa.   Whitney [name] is a 62 yrs old female, seen via telehealth, no acute distress Buspar, Risperdal, Lexapro refilled All questions answered  Encourage healthy lifestyle choices, including diet (rich in fruits, vegetables, and lean proteins, and low in salt and simple carbohydrates) and exercise (at  least 30 minutes of moderate physical activity daily).     The above assessment and management plan was discussed with the patient. The patient verbalized understanding of and has agreed to the management plan. Patient is aware to call the clinic if they develop any new symptoms or if symptoms persist or worsen. Patient is aware when to return to the clinic for a follow-up visit. Patient educated on when it is appropriate to go to the emergency department.   Follow Up Instructions: Return in about 3 months (around 08/14/2023) for Mental health.    I discussed the assessment and treatment plan with the patient. The patient was provided an opportunity to ask questions and all were answered. The patient agreed with the plan and demonstrated an understanding of the instructions.   The patient was advised to call back or seek an in-person evaluation if the symptoms worsen or if the condition fails to improve as anticipated.  The above assessment and management plan was discussed with the patient. The patient verbalized understanding of and has agreed to the management plan. Patient is aware to call the clinic if they  develop any new symptoms or if symptoms persist or worsen. Patient is aware when to return to the clinic for a follow-up visit. Patient educated on when it is appropriate to go to the emergency department.    I provided 10 minutes of time during this video encounter.   Arrie Aran Santa Lighter, DNP [facility] [address] [place], [place] [address] [phone] 05/14/2023

## 2023-06-10 ENCOUNTER — Telehealth: Payer: 59 | Admitting: Nurse Practitioner

## 2023-06-10 ENCOUNTER — Encounter: Payer: Self-pay | Admitting: Family Medicine

## 2023-06-10 ENCOUNTER — Telehealth (INDEPENDENT_AMBULATORY_CARE_PROVIDER_SITE_OTHER): Payer: 59 | Admitting: Family Medicine

## 2023-06-10 DIAGNOSIS — B9689 Other specified bacterial agents as the cause of diseases classified elsewhere: Secondary | ICD-10-CM | POA: Diagnosis not present

## 2023-06-10 DIAGNOSIS — J019 Acute sinusitis, unspecified: Secondary | ICD-10-CM

## 2023-06-10 MED ORDER — AMOXICILLIN-POT CLAVULANATE 875-125 MG PO TABS
1.0000 | ORAL_TABLET | Freq: Two times a day (BID) | ORAL | 0 refills | Status: DC
Start: 2023-06-10 — End: 2023-06-26

## 2023-06-10 MED ORDER — FLUTICASONE PROPIONATE 50 MCG/ACT NA SUSP
2.0000 | Freq: Every day | NASAL | 6 refills | Status: DC
Start: 2023-06-10 — End: 2024-01-15

## 2023-06-10 NOTE — Progress Notes (Signed)
MyChart Video visit  Subjective: WJ:XBJYN infection PCP: Martina Sinner, NP Whitney [name] is a 62 y.o. female. Patient provides verbal consent for consult held via video.  Due to COVID-19 pandemic this visit was conducted virtually. This visit type was conducted due to national recommendations for restrictions regarding the COVID-19 Pandemic (e.g. social distancing, sheltering in place) in an effort to limit this patient's exposure and mitigate transmission in our community. All issues noted in this document were discussed and addressed.  A physical exam was not performed with this format.   Location of patient: home Location of provider: [facility] Others present for call: none  1. Sinusitis Patient reports recurrent sinus infection. She reports facial pain, sinus pain and left otalgia that onset almost 2 weeks ago.  Cheeks and teeth now hurting so worried about secondary bacterial infection. She has used Advil D cold and sinus, this helps but causes irritability.  Has Sinex nasal spray.  No fevers, myalgia. Reports fatigue.  No known sick contacts.  No self testing for flu or COVID.    ROS: Per HPI  No Known Allergies No past medical history on file.  Current Outpatient Medications:    busPIRone (BUSPAR) 10 MG tablet, Take 1 tablet (10 mg total) by mouth 2 (two) times daily., Disp: 180 tablet, Rfl: 0   escitalopram (LEXAPRO) 10 MG tablet, Take 1 tablet (10 mg total) by mouth daily. STOP celexa., Disp: 90 tablet, Rfl: 0   oxybutynin (DITROPAN-XL) 10 MG 24 hr tablet, TAKE 1 TABLET BY MOUTH EVERY DAY, Disp: 90 tablet, Rfl: 0   risperiDONE (RISPERDAL) 0.25 MG tablet, Take 1 tablet (0.25 mg total) by mouth at bedtime., Disp: 90 tablet, Rfl: 0  Gen: nontoxic female, NAD HEENT: no facial swelling or scleral injection Pulm: normal WOB on room air.  Assessment/ Plan: 62 y.o. female   Acute bacterial sinusitis - Plan: amoxicillin-clavulanate (AUGMENTIN) 875-125 MG tablet,  fluticasone (FLONASE) 50 MCG/ACT nasal spray  Given progression of symptoms that are refractory to OTC management and now with facial pain, suspect bacterial sinusitis to oral her antibiotics have been sent.  Flonase also given to help with nasal congestion.  Agree with avoiding pseudoephedrine as this is causing unwanted side effects.  She may follow-up if symptoms or not improving.  Return precautions discussed.  She will follow-up as needed  Start time: 11:35a End time: 11:40  Total time spent on patient care (including video visit/ documentation): 6 minutes  Denim Kalmbach Hulen Skains, DO [facility] [phone]

## 2023-06-11 ENCOUNTER — Encounter: Payer: Self-pay | Admitting: Nurse Practitioner

## 2023-06-11 ENCOUNTER — Telehealth (INDEPENDENT_AMBULATORY_CARE_PROVIDER_SITE_OTHER): Payer: 59 | Admitting: Nurse Practitioner

## 2023-06-11 DIAGNOSIS — R4586 Emotional lability: Secondary | ICD-10-CM | POA: Diagnosis not present

## 2023-06-11 DIAGNOSIS — F331 Major depressive disorder, recurrent, moderate: Secondary | ICD-10-CM

## 2023-06-11 MED ORDER — RISPERIDONE 0.5 MG PO TABS: 0.5000 mg | ORAL_TABLET | Freq: Every day | ORAL | 1 refills | Status: DC

## 2023-06-11 NOTE — Progress Notes (Signed)
Virtual Visit via video Note Due to COVID-19 pandemic this visit was conducted virtually. This visit type was conducted due to national recommendations for restrictions regarding the COVID-19 Pandemic (e.g. social distancing, sheltering in place) in an effort to limit this patient's exposure and mitigate transmission in our community. All issues noted in this document were discussed and addressed.  A physical exam was not performed with this format.   I connected with Whitney [name] on 06/11/2023 at 1150 by Name and DOB and verified that I am speaking with the correct person using two identifiers. Whitney [name] is currently located at home and  is currently with them during visit. The provider, Martina Sinner, DNP is located in their office at time of visit.  I discussed the limitations, risks, security and privacy concerns of performing an evaluation and management service by virtual visit and the availability of in person appointments. I also discussed with the patient that there may be a patient responsible charge related to this service. The patient expressed understanding and agreed to proceed.  Subjective:  Patient ID: Whitney [name], female    DOB: January 13, 1961, 62 y.o.   MRN: [id number]  Chief Complaint:  Fussy (Still felling irritable)   HPI: Whitney [name] is a 62 y.o. female presenting on 06/11/2023 via telehealth for Fussy (Still felling irritable) Client has a past diagnosis of MDD, mood swings and currently being managed with Lexapro 10 mg daily BuSpar 10 mg twice daily and risperidone 0.25 mg was added at her last appointment.  Client was seen on May 14, 2023 via telehealth and reports mood had improved she is sleeping better and was more sociable.  Today she reports that she has been feeling irritated and does not feel like the dose of the medication is adequate "when you ask me on my feeling at her know how to put it into words" unsure whether is the holiday season that is causing  exacerbation.  She is still not agreeable to counseling.  As such we discussed increasing Risperdal and keeping BuSpar and Lexapro as is.  She denies suicidal homicidal ideation, denies any insomnia, feeling of hopelessness, and anhedonia  HPI   Relevant past medical, surgical, family, and social history reviewed and updated as indicated.  Allergies and medications reviewed and updated.   History reviewed. No pertinent past medical history.  Past Surgical History:  Procedure Laterality Date   LOBECTOMY     RECTAL PROLAPSE REPAIR  06/07/2021    Social History   Socioeconomic History   Marital status: Single    Spouse name: Not on file   Number of children: Not on file   Years of education: Not on file   Highest education level: 12th grade  Occupational History   Not on file  Tobacco Use   Smoking status: Never   Smokeless tobacco: Not on file  Vaping Use   Vaping status: Never Used  Substance and Sexual Activity   Alcohol use: Never   Drug use: Not on file   Sexual activity: Not on file  Other Topics Concern   Not on file  Social History Narrative   Not on file   Social Determinants of Health   Financial Resource Strain: Low Risk  (02/15/2023)   Overall Financial Resource Strain (CARDIA)    Difficulty of Paying Living Expenses: Not hard at all  Food Insecurity: No Food Insecurity (05/10/2023)   Hunger Vital Sign    Worried About Running Out of  Food in the Last Year: Never true    Ran Out of Food in the Last Year: Never true  Transportation Needs: No Transportation Needs (05/10/2023)   PRAPARE - Administrator, Civil Service (Medical): No    Lack of Transportation (Non-Medical): No  Physical Activity: Unknown (05/10/2023)   Exercise Vital Sign    Days of Exercise per Week: Patient declined    Minutes of Exercise per Session: Not on file  Stress: Stress Concern Present (05/10/2023)   [employer] of Occupational Health - Occupational Stress  Questionnaire    Feeling of Stress : To some extent  Social Connections: Moderately Isolated (05/10/2023)   Social Connection and Isolation Panel [NHANES]    Frequency of Communication with Friends and Family: More than three times a week    Frequency of Social Gatherings with Friends and Family: Three times a week    Attends Religious Services: Never    Active Member of Clubs or Organizations: No    Attends Banker Meetings: Not on file    Marital Status: Living with partner  Intimate Partner Violence: Unknown (03/19/2023)   Received from [facility]   HITS    Physically Hurt: Not on file    Insult or Talk Down To: Not on file    Threaten Physical Harm: Not on file    Scream or Curse: Not on file    Outpatient Encounter Medications as of 06/11/2023  Medication Sig   amoxicillin-clavulanate (AUGMENTIN) 875-125 MG tablet Take 1 tablet by mouth 2 (two) times daily.   escitalopram (LEXAPRO) 10 MG tablet Take 1 tablet (10 mg total) by mouth daily. STOP celexa.   fluticasone (FLONASE) 50 MCG/ACT nasal spray Place 2 sprays into both nostrils daily.   oxybutynin (DITROPAN-XL) 10 MG 24 hr tablet TAKE 1 TABLET BY MOUTH EVERY DAY   [DISCONTINUED] busPIRone (BUSPAR) 10 MG tablet Take 1 tablet (10 mg total) by mouth 2 (two) times daily.   [DISCONTINUED] risperiDONE (RISPERDAL) 0.25 MG tablet Take 1 tablet (0.25 mg total) by mouth at bedtime.   No facility-administered encounter medications on file as of 06/11/2023.    No Known Allergies  Review of Systems  Constitutional:  Negative for activity change, appetite change, chills, fatigue and fever.  HENT:  Positive for congestion and sinus pressure.        Currently being treated with amoxicillin  Respiratory:  Negative for chest tightness, shortness of breath and wheezing.   Gastrointestinal:  Negative for constipation, diarrhea and nausea.  Musculoskeletal:  Negative for back pain.  Skin:  Negative for color change, pallor and  rash.  Allergic/Immunologic: Negative for environmental allergies, food allergies and immunocompromised state.  Neurological:  Negative for dizziness, facial asymmetry and weakness.  Psychiatric/Behavioral:  Negative for behavioral problems, hallucinations, sleep disturbance and suicidal ideas. The patient is not nervous/anxious.        Very irritable with constant mood swings    Observations/Objective: No vital signs or physical exam, this was a virtual health encounter.  Pt alert and oriented, answers all questions appropriately, and able to speak in full sentences.    Assessment and Plan: Mendi was seen today for fussy.  Diagnoses and all orders for this visit:  Moderate episode of recurrent major depressive disorder (HCC)  Mood swings  Is a 62 year old Caucasian female seen via telehealth today no acute distress MDD: Continue Lexapro 10 mg daily and buspirone 10 mg twice daily Mood swings: Increase risperidone to 0.5 mg at bedtime  plan to follow-up with client on the 19th before the holiday for possible dose adjustment All question answered   Follow Up Instructions: Return in about 15 days (around 06/26/2023) for telehealth.    I discussed the assessment and treatment plan with the patient. The patient was provided an opportunity to ask questions and all were answered. The patient agreed with the plan and demonstrated an understanding of the instructions.   The patient was advised to call back or seek an in-person evaluation if the symptoms worsen or if the condition fails to improve as anticipated.  The above assessment and management plan was discussed with the patient. The patient verbalized understanding of and has agreed to the management plan. Patient is aware to call the clinic if they develop any new symptoms or if symptoms persist or worsen. Patient is aware when to return to the clinic for a follow-up visit. Patient educated on when it is appropriate to go to the  emergency department.    I provided 10 minutes of time during this video encounter.   Arrie Aran Santa Lighter, DNP [facility] [address] [place], [place] [address] [phone] 06/11/2023

## 2023-06-25 NOTE — Progress Notes (Unsigned)
Virtual Visit via video Note Due to COVID-19 pandemic this visit was conducted virtually. This visit type was conducted due to national recommendations for restrictions regarding the COVID-19 Pandemic (e.g. social distancing, sheltering in place) in an effort to limit this patient's exposure and mitigate transmission in our community. All issues noted in this document were discussed and addressed.  A physical exam was not performed with this format.   I connected with Whitney [name] on 06/26/2023 at 1130 by name and DOB and verified that I am speaking with the correct person using two identifiers. Whitney [name] is currently located at [facility]  during visit. The provider, Martina Sinner, DNP is in their home at time of visitt.  I discussed the limitations, risks, security and privacy concerns of performing an evaluation and management service by virtual visit and the availability of in person appointments. I also discussed with the patient that there may be a patient responsible charge related to this service. The patient expressed understanding and agreed to proceed.  Subjective:  Patient ID: Whitney [name], female    DOB: 19-Feb-1961, 62 y.o.   MRN: [id number]  Chief Complaint:  Follow-up (54-month f/u for mental health medication)   HPI: Whitney [name] is a 62 y.o. female presenting on 06/26/2023 for Follow-up (78-month f/u for mental health medication)  She was last seen Jun 11, 2023 at that visit Risperidone was increased. PMH MDD and mood swings Depression, Follow-up She  was last seen for this 2 weeks ago. Changes made at last visit include none.  She reports excellent compliance with treatment. She is not having side effects.  She reports  tolerance of treatment. Current symptoms include: irritability,  depressed mood She feels she is Improved since last visit.     06/26/2023   11:47 AM 04/14/2023   10:36 AM 03/12/2023    8:58 AM  Depression screen PHQ 2/9  Decreased Interest 1 2 2    Down, Depressed, Hopeless 1 2 2   PHQ - 2 Score 2 4 4   Altered sleeping 0 3 2  Tired, decreased energy 0 2 2  Change in appetite 1 0 2  Feeling bad or failure about yourself  0 2 2  Trouble concentrating 1 2 2   Moving slowly or fidgety/restless 0 1 2  Suicidal thoughts 0 0 0  PHQ-9 Score 4 14 16   Difficult doing work/chores Not difficult at all Somewhat difficult Somewhat difficult    Mood Swings She has history of mood disorder who was seen 2 weeks ago for follow-up. At that visit, the dose of Risperidone was increased by 0.5 mg. At today's appointment, the patient reports improvement in mood since the adjustment. She states, "I am feeling better" and mentions that she informed her husband about the visit, asking him if he noticed any changes. According to her, he has observed an improvement in her mood as well. There are no additional complaints regarding mood, sleep, or other symptoms at this time. The patient is otherwise stable, and there are no concerns regarding side effects from the medication adjustment.  Relevant past medical, surgical, family, and social history reviewed and updated as indicated.  Allergies and medications reviewed and updated.   History reviewed. No pertinent past medical history.  Past Surgical History:  Procedure Laterality Date   LOBECTOMY     RECTAL PROLAPSE REPAIR  06/07/2021    Social History   Socioeconomic History   Marital status: Single    Spouse name: Not  on file   Number of children: Not on file   Years of education: Not on file   Highest education level: 12th grade  Occupational History   Not on file  Tobacco Use   Smoking status: Never   Smokeless tobacco: Not on file  Vaping Use   Vaping status: Never Used  Substance and Sexual Activity   Alcohol use: Never   Drug use: Not on file   Sexual activity: Not on file  Other Topics Concern   Not on file  Social History Narrative   Not on file   Social Drivers of Health    Financial Resource Strain: Low Risk  (02/15/2023)   Overall Financial Resource Strain (CARDIA)    Difficulty of Paying Living Expenses: Not hard at all  Food Insecurity: No Food Insecurity (05/10/2023)   Hunger Vital Sign    Worried About Running Out of Food in the Last Year: Never true    Ran Out of Food in the Last Year: Never true  Transportation Needs: No Transportation Needs (05/10/2023)   PRAPARE - Administrator, Civil Service (Medical): No    Lack of Transportation (Non-Medical): No  Physical Activity: Unknown (05/10/2023)   Exercise Vital Sign    Days of Exercise per Week: Patient declined    Minutes of Exercise per Session: Not on file  Stress: Stress Concern Present (05/10/2023)   [employer] of Occupational Health - Occupational Stress Questionnaire    Feeling of Stress : To some extent  Social Connections: Moderately Isolated (05/10/2023)   Social Connection and Isolation Panel [NHANES]    Frequency of Communication with Friends and Family: More than three times a week    Frequency of Social Gatherings with Friends and Family: Three times a week    Attends Religious Services: Never    Active Member of Clubs or Organizations: No    Attends Banker Meetings: Not on file    Marital Status: Living with partner  Intimate Partner Violence: Unknown (03/19/2023)   Received from [facility]   HITS    Physically Hurt: Not on file    Insult or Talk Down To: Not on file    Threaten Physical Harm: Not on file    Scream or Curse: Not on file    Outpatient Encounter Medications as of 06/26/2023  Medication Sig   escitalopram (LEXAPRO) 10 MG tablet Take 1 tablet (10 mg total) by mouth daily. STOP celexa.   fluticasone (FLONASE) 50 MCG/ACT nasal spray Place 2 sprays into both nostrils daily.   oxybutynin (DITROPAN-XL) 10 MG 24 hr tablet TAKE 1 TABLET BY MOUTH EVERY DAY   risperiDONE (RISPERDAL) 0.5 MG tablet Take 1 tablet (0.5 mg total) by mouth at  bedtime.   [DISCONTINUED] amoxicillin-clavulanate (AUGMENTIN) 875-125 MG tablet Take 1 tablet by mouth 2 (two) times daily.   [DISCONTINUED] escitalopram (LEXAPRO) 10 MG tablet Take 1 tablet (10 mg total) by mouth daily. STOP celexa.   [DISCONTINUED] risperiDONE (RISPERDAL) 0.5 MG tablet Take 1 tablet (0.5 mg total) by mouth at bedtime.   No facility-administered encounter medications on file as of 06/26/2023.    No Known Allergies  Review of Systems  Constitutional:  Negative for activity change, appetite change and fatigue.  HENT:  Negative for congestion, rhinorrhea and sinus pressure.   Respiratory:  Negative for cough, chest tightness and shortness of breath.   Cardiovascular:  Negative for chest pain and palpitations.  Endocrine: Negative for heat intolerance, polydipsia, polyphagia and  polyuria.  Skin:  Negative for color change and rash.  Neurological:  Negative for dizziness and headaches.  Psychiatric/Behavioral:  Negative for agitation, dysphoric mood, hallucinations, sleep disturbance and suicidal ideas. The patient is not nervous/anxious.        Mood improved with increase dose of Risperdone    Observations/Objective: No vital signs or physical exam, this was a virtual health encounter.  Pt alert and oriented, answers all questions appropriately, and able to speak in full sentences.    Assessment and Plan: Whitney [name] was seen today for follow-up.  Diagnoses and all orders for this visit:  Moderate episode of recurrent major depressive disorder (HCC) -     escitalopram (LEXAPRO) 10 MG tablet; Take 1 tablet (10 mg total) by mouth daily. STOP celexa.  Mood swings  Other orders -     risperiDONE (RISPERDAL) 0.5 MG tablet; Take 1 tablet (0.5 mg total) by mouth at bedtime.   This is a 62 year old Caucasian female seen today via telehealth for mood swing and depression, no acute distress.  Depression continue Lexapro 10 mg daily Mood swings: Continue risperidone 0.5 mg at  bedtime All question answered  Encourage healthy lifestyle choices, including diet (rich in fruits, vegetables, and lean proteins, and low in salt and simple carbohydrates) and exercise (at least 30 minutes of moderate physical activity daily).     The above assessment and management plan was discussed with the patient. The patient verbalized understanding of and has agreed to the management plan. Patient is aware to call the clinic if they develop any new symptoms or if symptoms persist or worsen. Patient is aware when to return to the clinic for a follow-up visit. Patient educated on when it is appropriate to go to the emergency department.   Follow Up Instructions: Return in about 3 months (around 09/24/2023).  I discussed the assessment and treatment plan with the patient. The patient was provided an opportunity to ask questions and all were answered. The patient agreed with the plan and demonstrated an understanding of the instructions.   The patient was advised to call back or seek an in-person evaluation if the symptoms worsen or if the condition fails to improve as anticipated.  The above assessment and management plan was discussed with the patient. The patient verbalized understanding of and has agreed to the management plan. Patient is aware to call the clinic if they develop any new symptoms or if symptoms persist or worsen. Patient is aware when to return to the clinic for a follow-up visit. Patient educated on when it is appropriate to go to the emergency department.    I provided 12 minutes of time during this video encounter.   Arrie Aran Santa Lighter, DNP [facility] [address] [place], [place] [address] [phone] 06/26/2023

## 2023-06-26 ENCOUNTER — Telehealth (INDEPENDENT_AMBULATORY_CARE_PROVIDER_SITE_OTHER): Payer: 59 | Admitting: Nurse Practitioner

## 2023-06-26 ENCOUNTER — Encounter: Payer: Self-pay | Admitting: Nurse Practitioner

## 2023-06-26 DIAGNOSIS — R4586 Emotional lability: Secondary | ICD-10-CM | POA: Diagnosis not present

## 2023-06-26 DIAGNOSIS — F331 Major depressive disorder, recurrent, moderate: Secondary | ICD-10-CM

## 2023-06-26 MED ORDER — RISPERIDONE 0.5 MG PO TABS: 0.5000 mg | ORAL_TABLET | Freq: Every day | ORAL | 1 refills | Status: DC

## 2023-06-26 MED ORDER — ESCITALOPRAM OXALATE 10 MG PO TABS
10.0000 mg | ORAL_TABLET | Freq: Every day | ORAL | 0 refills | Status: DC
Start: 2023-06-26 — End: 2023-08-27

## 2023-08-03 ENCOUNTER — Other Ambulatory Visit: Payer: Self-pay | Admitting: Nurse Practitioner

## 2023-08-03 DIAGNOSIS — N3281 Overactive bladder: Secondary | ICD-10-CM

## 2023-08-04 ENCOUNTER — Other Ambulatory Visit: Payer: Self-pay | Admitting: Nurse Practitioner

## 2023-08-04 DIAGNOSIS — N3281 Overactive bladder: Secondary | ICD-10-CM

## 2023-08-04 MED ORDER — OXYBUTYNIN CHLORIDE ER 10 MG PO TB24
10.0000 mg | ORAL_TABLET | Freq: Every day | ORAL | 0 refills | Status: DC
Start: 2023-08-04 — End: 2023-11-24

## 2023-08-26 NOTE — Progress Notes (Unsigned)
Virtual Visit via video Note Due to COVID-19 pandemic this visit was conducted virtually. This visit type was conducted due to national recommendations for restrictions regarding the COVID-19 Pandemic (e.g. social distancing, sheltering in place) in an effort to limit this patient's exposure and mitigate transmission in our community. All issues noted in this document were discussed and addressed.  A physical exam was not performed with this format.   I connected with Whitney [name] on 08/27/2023 at 0845  by name and DOB and verified that I am speaking with the correct person using two identifiers. Whitney [name] is currently located at home  during visit. The provider, Martina Sinner, NP is located in their office at time of visit.  I discussed the limitations, risks, security and privacy concerns of performing an evaluation and management service by virtual visit and the availability of in person appointments. I also discussed with the patient that there may be a patient responsible charge related to this service. The patient expressed understanding and agreed to proceed.  Subjective:  Patient ID: Whitney [name], female    DOB: [birth year], 63 y.o.   MRN: [id number]  Chief Complaint:  URI (" Fever, cough, myalgia for 2 days and increase mood swings is like to have medication titrated")   HPI: Whitney [name] is a 63 y.o. female presenting on 08/27/2023 for URI (" Fever, cough, myalgia for 2 days and increase mood swings is like to have medication titrated")  Depression, Follow-up  She  was last seen for this 2 months ago. Changes made at last visit include add Risperdal 0.5 mg . She reports excellent compliance with treatment. She is not having side effects.  She reports excellent tolerance of treatment. Current symptoms include: depressed mood and mood swings She feels she is Worse since last visit.     06/26/2023   11:47 AM 04/14/2023   10:36 AM 03/12/2023    8:58 AM  Depression screen  PHQ 2/9  Decreased Interest 1 2 2   Down, Depressed, Hopeless 1 2 2   PHQ - 2 Score 2 4 4   Altered sleeping 0 3 2  Tired, decreased energy 0 2 2  Change in appetite 1 0 2  Feeling bad or failure about yourself  0 2 2  Trouble concentrating 1 2 2   Moving slowly or fidgety/restless 0 1 2  Suicidal thoughts 0 0 0  PHQ-9 Score 4 14 16   Difficult doing work/chores Not difficult at all Somewhat difficult Somewhat difficult    Influenza: She complains of congestion, dry cough, myalgias, headache, and fevers up to 102.7 degrees for 2 days. She denies a history of chest pain, nausea, vomiting, and wheezing and denies a history of asthma.    Relevant past medical, surgical, family, and social history reviewed and updated as indicated.  Allergies and medications reviewed and updated.   History reviewed. No pertinent past medical history.  Past Surgical History:  Procedure Laterality Date   LOBECTOMY     RECTAL PROLAPSE REPAIR  06/07/2021    Social History   Socioeconomic History   Marital status: Single    Spouse name: Not on file   Number of children: Not on file   Years of education: Not on file   Highest education level: 12th grade  Occupational History   Not on file  Tobacco Use   Smoking status: Never   Smokeless tobacco: Not on file  Vaping Use   Vaping status: Never Used  Substance  and Sexual Activity   Alcohol use: Never   Drug use: Not on file   Sexual activity: Not on file  Other Topics Concern   Not on file  Social History Narrative   Not on file   Social Drivers of Health   Financial Resource Strain: Low Risk  (02/15/2023)   Overall Financial Resource Strain (CARDIA)    Difficulty of Paying Living Expenses: Not hard at all  Food Insecurity: No Food Insecurity (05/10/2023)   Hunger Vital Sign    Worried About Running Out of Food in the Last Year: Never true    Ran Out of Food in the Last Year: Never true  Transportation Needs: No Transportation Needs  (05/10/2023)   PRAPARE - Administrator, Civil Service (Medical): No    Lack of Transportation (Non-Medical): No  Physical Activity: Unknown (05/10/2023)   Exercise Vital Sign    Days of Exercise per Week: Patient declined    Minutes of Exercise per Session: Not on file  Stress: Stress Concern Present (05/10/2023)   [employer] of Occupational Health - Occupational Stress Questionnaire    Feeling of Stress : To some extent  Social Connections: Moderately Isolated (05/10/2023)   Social Connection and Isolation Panel [NHANES]    Frequency of Communication with Friends and Family: More than three times a week    Frequency of Social Gatherings with Friends and Family: Three times a week    Attends Religious Services: Never    Active Member of Clubs or Organizations: No    Attends Banker Meetings: Not on file    Marital Status: Living with partner  Intimate Partner Violence: Unknown (03/19/2023)   Received from [facility]   HITS    Physically Hurt: Not on file    Insult or Talk Down To: Not on file    Threaten Physical Harm: Not on file    Scream or Curse: Not on file    Outpatient Encounter Medications as of 08/27/2023  Medication Sig   escitalopram (LEXAPRO) 20 MG tablet Take 1 tablet (20 mg total) by mouth daily.   guaifenesin (HUMIBID E) 400 MG TABS tablet Take 1 tablet (400 mg total) by mouth every 6 (six) hours as needed.   oseltamivir (TAMIFLU) 75 MG capsule Take 1 capsule (75 mg total) by mouth 2 (two) times daily.   risperiDONE (RISPERDAL) 1 MG tablet Take 1 tablet (1 mg total) by mouth at bedtime.   fluticasone (FLONASE) 50 MCG/ACT nasal spray Place 2 sprays into both nostrils daily.   oxybutynin (DITROPAN-XL) 10 MG 24 hr tablet Take 1 tablet (10 mg total) by mouth daily.   [DISCONTINUED] escitalopram (LEXAPRO) 10 MG tablet Take 1 tablet (10 mg total) by mouth daily. STOP celexa.   [DISCONTINUED] risperiDONE (RISPERDAL) 0.5 MG tablet Take 1 tablet  (0.5 mg total) by mouth at bedtime.   No facility-administered encounter medications on file as of 08/27/2023.    No Known Allergies  Review of Systems  Constitutional:  Positive for chills and fever.  HENT:  Negative for ear pain, facial swelling and sore throat.   Respiratory:  Positive for cough and wheezing.   Cardiovascular:  Negative for chest pain and leg swelling.  Gastrointestinal:  Negative for nausea and vomiting.  Musculoskeletal:  Positive for myalgias.  Skin:  Negative for color change and wound.  Neurological:  Positive for headaches. Negative for dizziness.  Psychiatric/Behavioral:  Positive for behavioral problems. Negative for suicidal ideas. The patient is not  nervous/anxious.        Increase mood swings   Observations/Objective: No vital signs or physical exam, this was a virtual health encounter.  Pt alert and oriented, answers all questions appropriately, and able to speak in full sentences.    Assessment and Plan: Whitney [name] was seen today for uri.  Diagnoses and all orders for this visit:  Influenza A virus present -     oseltamivir (TAMIFLU) 75 MG capsule; Take 1 capsule (75 mg total) by mouth 2 (two) times daily.  Moderate episode of recurrent major depressive disorder (HCC) -     escitalopram (LEXAPRO) 20 MG tablet; Take 1 tablet (20 mg total) by mouth daily. -     risperiDONE (RISPERDAL) 1 MG tablet; Take 1 tablet (1 mg total) by mouth at bedtime.  Mood swings -     risperiDONE (RISPERDAL) 1 MG tablet; Take 1 tablet (1 mg total) by mouth at bedtime.  Acute cough -     guaifenesin (HUMIBID E) 400 MG TABS tablet; Take 1 tablet (400 mg total) by mouth every 6 (six) hours as needed.   This is a 63 year old Caucasian female seen today via video visit for URI symptoms and mental health medication titration MDD: Will increase Lexapro to 20 mg twice daily Mood swings: Increase Risperdal to 1 mg at bedtime Influenza: Tamiflu for 5 days; guaifenesin for cough;  Tylenol ibuprofen for fever Increase hydration, rest Follow Up Instructions: Return in about 1 month (around 09/24/2023) for medication check.    I discussed the assessment and treatment plan with the patient. The patient was provided an opportunity to ask questions and all were answered. The patient agreed with the plan and demonstrated an understanding of the instructions.   The patient was advised to call back or seek an in-person evaluation if the symptoms worsen or if the condition fails to improve as anticipated.  The above assessment and management plan was discussed with the patient. The patient verbalized understanding of and has agreed to the management plan. Patient is aware to call the clinic if they develop any new symptoms or if symptoms persist or worsen. Patient is aware when to return to the clinic for a follow-up visit. Patient educated on when it is appropriate to go to the emergency department.    I provided 12 minutes of time during this video encounter.   Arrie Aran Santa Lighter, DNP [facility] [address] [place], [place] [address] [phone] 08/27/2023

## 2023-08-27 ENCOUNTER — Telehealth (INDEPENDENT_AMBULATORY_CARE_PROVIDER_SITE_OTHER): Payer: 59 | Admitting: Nurse Practitioner

## 2023-08-27 ENCOUNTER — Encounter: Payer: Self-pay | Admitting: Nurse Practitioner

## 2023-08-27 ENCOUNTER — Other Ambulatory Visit: Payer: Self-pay | Admitting: Nurse Practitioner

## 2023-08-27 DIAGNOSIS — J101 Influenza due to other identified influenza virus with other respiratory manifestations: Secondary | ICD-10-CM | POA: Insufficient documentation

## 2023-08-27 DIAGNOSIS — F331 Major depressive disorder, recurrent, moderate: Secondary | ICD-10-CM | POA: Diagnosis not present

## 2023-08-27 DIAGNOSIS — R4586 Emotional lability: Secondary | ICD-10-CM | POA: Diagnosis not present

## 2023-08-27 DIAGNOSIS — R051 Acute cough: Secondary | ICD-10-CM

## 2023-08-27 MED ORDER — RISPERIDONE 1 MG PO TABS
1.0000 mg | ORAL_TABLET | Freq: Every day | ORAL | 0 refills | Status: DC
Start: 2023-08-27 — End: 2023-09-24

## 2023-08-27 MED ORDER — ESCITALOPRAM OXALATE 20 MG PO TABS
20.0000 mg | ORAL_TABLET | Freq: Every day | ORAL | 0 refills | Status: DC
Start: 2023-08-27 — End: 2023-09-24

## 2023-08-27 MED ORDER — GUAIFENESIN 400 MG PO TABS: 400.0000 mg | ORAL_TABLET | Freq: Four times a day (QID) | ORAL | 0 refills | Status: DC | PRN

## 2023-08-27 MED ORDER — OSELTAMIVIR PHOSPHATE 75 MG PO CAPS
75.0000 mg | ORAL_CAPSULE | Freq: Two times a day (BID) | ORAL | 0 refills | Status: DC
Start: 2023-08-27 — End: 2023-09-24

## 2023-09-18 NOTE — Progress Notes (Signed)
 Virtual Visit via video Note Due to COVID-19 pandemic this visit was conducted virtually. This visit type was conducted due to national recommendations for restrictions regarding the COVID-19 Pandemic (e.g. social distancing, sheltering in place) in an effort to limit this patient's exposure and mitigate transmission in our community. All issues noted in this document were discussed and addressed.  A physical exam was not performed with this format.   I connected with Whitney [name] on 09/24/2023 at 1145 by name and DOB and verified that I am speaking with the correct person using two identifiers. Whitney [name] is currently located at home and  husband  is currently with them during visit. The provider, Martina Sinner, NP is located in their office at time of visit.  I discussed the limitations, risks, security and privacy concerns of performing an evaluation and management service by virtual visit and the availability of in person appointments. I also discussed with the patient that there may be a patient responsible charge related to this service. The patient expressed understanding and agreed to proceed.  Subjective:  Patient ID: Whitney [name], female    DOB: 11-14-1960, 63 y.o.   MRN: [id number]  Chief Complaint:  Depression ("Mood swing improve, feel like the dosage is right")   HPI: Whitney [name] is a 63 y.o. female presenting on 09/24/2023 for Depression ("Mood swing improve, feel like the dosage is right")  Depression, Follow-up She  was last seen for this 1 months ago. Changes made at last visit include increase Risperdal 1 mg daily. She reports excellent compliance with treatment. She is not having side effects.  She reports excellent tolerance of treatment. Current symptoms include:  none " I ma feeling more like myself, sleeping better, mood is great" She feels she is Improved since last visit.     06/26/2023   11:47 AM 04/14/2023   10:36 AM 03/12/2023    8:58 AM  Depression  screen PHQ 2/9  Decreased Interest 1 2 2   Down, Depressed, Hopeless 1 2 2   PHQ - 2 Score 2 4 4   Altered sleeping 0 3 2  Tired, decreased energy 0 2 2  Change in appetite 1 0 2  Feeling bad or failure about yourself  0 2 2  Trouble concentrating 1 2 2   Moving slowly or fidgety/restless 0 1 2  Suicidal thoughts 0 0 0  PHQ-9 Score 4 14 16   Difficult doing work/chores Not difficult at all Somewhat difficult Somewhat difficult    -----------------------------------------------------------------------------------------  Relevant past medical, surgical, family, and social history reviewed and updated as indicated.  Allergies and medications reviewed and updated.   History reviewed. No pertinent past medical history.  Past Surgical History:  Procedure Laterality Date   LOBECTOMY     RECTAL PROLAPSE REPAIR  06/07/2021    Social History   Socioeconomic History   Marital status: Single    Spouse name: Not on file   Number of children: Not on file   Years of education: Not on file   Highest education level: 12th grade  Occupational History   Not on file  Tobacco Use   Smoking status: Never   Smokeless tobacco: Not on file  Vaping Use   Vaping status: Never Used  Substance and Sexual Activity   Alcohol use: Never   Drug use: Not on file   Sexual activity: Not on file  Other Topics Concern   Not on file  Social History Narrative   Not  on file   Social Drivers of Health   Financial Resource Strain: Low Risk  (02/15/2023)   Overall Financial Resource Strain (CARDIA)    Difficulty of Paying Living Expenses: Not hard at all  Food Insecurity: No Food Insecurity (05/10/2023)   Hunger Vital Sign    Worried About Running Out of Food in the Last Year: Never true    Ran Out of Food in the Last Year: Never true  Transportation Needs: No Transportation Needs (05/10/2023)   PRAPARE - Administrator, Civil Service (Medical): No    Lack of Transportation (Non-Medical): No   Physical Activity: Unknown (05/10/2023)   Exercise Vital Sign    Days of Exercise per Week: Patient declined    Minutes of Exercise per Session: Not on file  Stress: Stress Concern Present (05/10/2023)   [employer] of Occupational Health - Occupational Stress Questionnaire    Feeling of Stress : To some extent  Social Connections: Moderately Isolated (05/10/2023)   Social Connection and Isolation Panel [NHANES]    Frequency of Communication with Friends and Family: More than three times a week    Frequency of Social Gatherings with Friends and Family: Three times a week    Attends Religious Services: Never    Active Member of Clubs or Organizations: No    Attends Banker Meetings: Not on file    Marital Status: Living with partner  Intimate Partner Violence: Unknown (03/19/2023)   Received from [facility]   HITS    Physically Hurt: Not on file    Insult or Talk Down To: Not on file    Threaten Physical Harm: Not on file    Scream or Curse: Not on file    Outpatient Encounter Medications as of 09/24/2023  Medication Sig   busPIRone (BUSPAR) 10 MG tablet Take 1 tablet (10 mg total) by mouth 2 (two) times daily.   escitalopram (LEXAPRO) 20 MG tablet Take 1 tablet (20 mg total) by mouth daily.   fluticasone (FLONASE) 50 MCG/ACT nasal spray Place 2 sprays into both nostrils daily.   oxybutynin (DITROPAN-XL) 10 MG 24 hr tablet Take 1 tablet (10 mg total) by mouth daily.   risperiDONE (RISPERDAL) 1 MG tablet Take 1 tablet (1 mg total) by mouth at bedtime.   [DISCONTINUED] escitalopram (LEXAPRO) 20 MG tablet Take 1 tablet (20 mg total) by mouth daily.   [DISCONTINUED] guaifenesin (HUMIBID E) 400 MG TABS tablet Take 1 tablet (400 mg total) by mouth every 6 (six) hours as needed.   [DISCONTINUED] oseltamivir (TAMIFLU) 75 MG capsule Take 1 capsule (75 mg total) by mouth 2 (two) times daily.   [DISCONTINUED] risperiDONE (RISPERDAL) 1 MG tablet Take 1 tablet (1 mg total) by  mouth at bedtime.   No facility-administered encounter medications on file as of 09/24/2023.    No Known Allergies  Review of Systems  Constitutional:  Negative for activity change, appetite change and fatigue.  HENT:  Negative for congestion, sinus pain and sore throat.   Respiratory:  Negative for cough, shortness of breath and wheezing.   Cardiovascular:  Negative for chest pain, palpitations and leg swelling.  Gastrointestinal:  Negative for constipation, diarrhea, nausea and vomiting.  Skin:  Negative for color change, pallor and rash.  Neurological:  Negative for dizziness and headaches.  Psychiatric/Behavioral:  Negative for agitation, behavioral problems, sleep disturbance and suicidal ideas. The patient is not nervous/anxious.     Observations/Objective: No vital signs or physical exam, this was a  virtual health encounter.  Pt alert and oriented, answers all questions appropriately, and able to speak in full sentences.    Assessment and Plan: Whitney [name] was seen today for depression.  Diagnoses and all orders for this visit:  Moderate episode of recurrent major depressive disorder (HCC) -     escitalopram (LEXAPRO) 20 MG tablet; Take 1 tablet (20 mg total) by mouth daily. -     risperiDONE (RISPERDAL) 1 MG tablet; Take 1 tablet (1 mg total) by mouth at bedtime. -     busPIRone (BUSPAR) 10 MG tablet; Take 1 tablet (10 mg total) by mouth 2 (two) times daily.  Mood swings -     risperiDONE (RISPERDAL) 1 MG tablet; Take 1 tablet (1 mg total) by mouth at bedtime.    This is a 63 year old Caucasian female seen today via telehealth for MDD, no acute distress Plan to continue Lexapro 10 mg daily, BuSpar 10 mg twice daily and Risperdal 1 mg at bedtime for medication refill All question answered, client instructed to call the clinic with any question or concern Follow Up Instructions: Return in about 3 months (around 12/25/2023).    I discussed the assessment and treatment plan  with the patient. The patient was provided an opportunity to ask questions and all were answered. The patient agreed with the plan and demonstrated an understanding of the instructions.   The patient was advised to call back or seek an in-person evaluation if the symptoms worsen or if the condition fails to improve as anticipated.  The above assessment and management plan was discussed with the patient. The patient verbalized understanding of and has agreed to the management plan. Patient is aware to call the clinic if they develop any new symptoms or if symptoms persist or worsen. Patient is aware when to return to the clinic for a follow-up visit. Patient educated on when it is appropriate to go to the emergency department.    I provided 12 minutes of time during this video encounter.   Arrie Aran Santa Lighter, DNP [facility] [address] [place], [place] [address] [phone] 09/24/2023

## 2023-09-24 ENCOUNTER — Telehealth (INDEPENDENT_AMBULATORY_CARE_PROVIDER_SITE_OTHER): Payer: 59 | Admitting: Nurse Practitioner

## 2023-09-24 ENCOUNTER — Encounter: Payer: Self-pay | Admitting: Nurse Practitioner

## 2023-09-24 DIAGNOSIS — F331 Major depressive disorder, recurrent, moderate: Secondary | ICD-10-CM

## 2023-09-24 DIAGNOSIS — R4586 Emotional lability: Secondary | ICD-10-CM

## 2023-09-24 MED ORDER — RISPERIDONE 1 MG PO TABS: 1.0000 mg | ORAL_TABLET | Freq: Every day | ORAL | 0 refills | Status: DC

## 2023-09-24 MED ORDER — BUSPIRONE HCL 10 MG PO TABS
10.0000 mg | ORAL_TABLET | Freq: Two times a day (BID) | ORAL | 0 refills | Status: DC
Start: 2023-09-24 — End: 2023-12-17

## 2023-09-24 MED ORDER — ESCITALOPRAM OXALATE 20 MG PO TABS
20.0000 mg | ORAL_TABLET | Freq: Every day | ORAL | 0 refills | Status: DC
Start: 2023-09-24 — End: 2024-02-10

## 2023-11-23 ENCOUNTER — Other Ambulatory Visit: Payer: Self-pay | Admitting: Nurse Practitioner

## 2023-11-23 DIAGNOSIS — N3281 Overactive bladder: Secondary | ICD-10-CM

## 2023-12-17 ENCOUNTER — Other Ambulatory Visit: Payer: Self-pay | Admitting: Nurse Practitioner

## 2023-12-17 DIAGNOSIS — F331 Major depressive disorder, recurrent, moderate: Secondary | ICD-10-CM

## 2023-12-24 ENCOUNTER — Telehealth: Payer: Self-pay | Admitting: Nurse Practitioner

## 2023-12-24 NOTE — Telephone Encounter (Signed)
 Lmtcb r/s CPE for 30 MINS with Adell Hones. Please schedule correctly. The apt on 12/29/2023 was scheduled incorrectly.

## 2023-12-29 ENCOUNTER — Ambulatory Visit: Admitting: Nurse Practitioner

## 2024-01-15 ENCOUNTER — Other Ambulatory Visit: Payer: Self-pay | Admitting: Nurse Practitioner

## 2024-01-15 ENCOUNTER — Other Ambulatory Visit: Payer: Self-pay | Admitting: Family Medicine

## 2024-01-15 DIAGNOSIS — F331 Major depressive disorder, recurrent, moderate: Secondary | ICD-10-CM

## 2024-01-15 DIAGNOSIS — B9689 Other specified bacterial agents as the cause of diseases classified elsewhere: Secondary | ICD-10-CM

## 2024-01-28 ENCOUNTER — Other Ambulatory Visit: Payer: Self-pay | Admitting: Nurse Practitioner

## 2024-01-28 DIAGNOSIS — Z1231 Encounter for screening mammogram for malignant neoplasm of breast: Secondary | ICD-10-CM

## 2024-02-10 ENCOUNTER — Other Ambulatory Visit: Payer: Self-pay | Admitting: Nurse Practitioner

## 2024-02-10 DIAGNOSIS — F331 Major depressive disorder, recurrent, moderate: Secondary | ICD-10-CM

## 2024-02-14 ENCOUNTER — Other Ambulatory Visit: Payer: Self-pay | Admitting: Nurse Practitioner

## 2024-02-14 DIAGNOSIS — R4586 Emotional lability: Secondary | ICD-10-CM

## 2024-02-14 DIAGNOSIS — F331 Major depressive disorder, recurrent, moderate: Secondary | ICD-10-CM

## 2024-02-22 ENCOUNTER — Other Ambulatory Visit: Payer: Self-pay | Admitting: Nurse Practitioner

## 2024-02-22 DIAGNOSIS — N3281 Overactive bladder: Secondary | ICD-10-CM

## 2024-02-27 ENCOUNTER — Ambulatory Visit (HOSPITAL_COMMUNITY)

## 2024-02-27 ENCOUNTER — Encounter (HOSPITAL_COMMUNITY): Payer: Self-pay

## 2024-03-10 ENCOUNTER — Encounter: Admitting: Nurse Practitioner

## 2024-03-19 ENCOUNTER — Encounter: Payer: Self-pay | Admitting: *Deleted

## 2024-03-22 ENCOUNTER — Ambulatory Visit (HOSPITAL_COMMUNITY)

## 2024-03-31 ENCOUNTER — Ambulatory Visit: Admitting: Nurse Practitioner

## 2024-04-17 ENCOUNTER — Other Ambulatory Visit: Payer: Self-pay | Admitting: Nurse Practitioner

## 2024-04-17 DIAGNOSIS — F331 Major depressive disorder, recurrent, moderate: Secondary | ICD-10-CM

## 2024-04-19 NOTE — Telephone Encounter (Signed)
30 days sent ntbs

## 2024-05-04 ENCOUNTER — Other Ambulatory Visit: Payer: Self-pay | Admitting: Medical Genetics

## 2024-05-04 DIAGNOSIS — Z006 Encounter for examination for normal comparison and control in clinical research program: Secondary | ICD-10-CM

## 2024-05-16 ENCOUNTER — Other Ambulatory Visit: Payer: Self-pay | Admitting: *Deleted

## 2024-05-16 DIAGNOSIS — N3281 Overactive bladder: Secondary | ICD-10-CM

## 2024-05-16 DIAGNOSIS — F331 Major depressive disorder, recurrent, moderate: Secondary | ICD-10-CM

## 2024-05-16 DIAGNOSIS — R4586 Emotional lability: Secondary | ICD-10-CM

## 2024-06-02 ENCOUNTER — Other Ambulatory Visit: Payer: Self-pay | Admitting: Nurse Practitioner

## 2024-06-02 ENCOUNTER — Encounter: Admitting: Nurse Practitioner

## 2024-06-02 DIAGNOSIS — Z1231 Encounter for screening mammogram for malignant neoplasm of breast: Secondary | ICD-10-CM

## 2024-06-07 ENCOUNTER — Inpatient Hospital Stay: Admission: RE | Admit: 2024-06-07 | Source: Ambulatory Visit

## 2024-06-17 NOTE — Progress Notes (Signed)
 Subjective:  Patient ID: Whitney [name], female    DOB: 02-Apr-1961, 63 y.o.   MRN: [id number]  Patient Care Team: Deitra Morton Sebastian Nena, NP as PCP - General (Nurse Practitioner)   Chief Complaint:  Annual Exam   HPI: Whitney [name] is a 63 y.o. female presenting on 06/21/2024 for Annual Exam   Discussed the use of AI scribe software for clinical note transcription with the patient, who gave verbal consent to proceed.  History of Present Illness Whitney [name] is a 63 year old female here today for physical She has hx of major depression who presents with worsening depressive symptoms and weight gain.  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 Significant fatigue is present, with a sensation of 'heaviness' and a lack of motivation to engage in activities. Sleep disturbances are notable, as she often takes until 2:30 AM to fall asleep despite going to bed around 10:30 PM. Restless leg symptoms are present, and she uses an over-the-counter medication for it, which she finds effective. Night sweats occur despite sleeping with a fan, and she reports worsening hand tremors over the past year.  She has gained nine pounds since her last visit in October, despite maintaining a consistent diet free of junk food. She attributes some of the weight gain to menopause, noting the absence of menstruation for thirteen years and experiencing hot flashes. Concerns about a slowing metabolism with age are expressed.  She reports taking Buspar  10 mg twice a day, escitalopram  (Lexapro ) 20 mg daily, and Risperdal  at night. Despite these, she reports not sleeping well. She denies smoking or drinking alcohol, consuming only water and unsweetened tea.  Increased anxiety is present, with chest pains that resolve with carbonated drinks, and a decreased sex drive, which she attributes to menopause and aging. She notes her grandmother had Parkinson's  disease and expresses concern about her own shaking symptoms.  She recently had to euthanize one of her dogs, which has been emotionally challenging, especially during the holidays.       06/21/2024    9:38 AM 04/14/2023   10:36 AM 03/12/2023    8:58 AM 02/17/2023   12:40 PM  GAD 7 : Generalized Anxiety Score  Nervous, Anxious, on Edge 2 3 2 2   Control/stop worrying 3 1 2 2   Worry too much - different things 3 1 2 2   Trouble relaxing 2 2 2 2   Restless 0 2 2 2   Easily annoyed or irritable 3 3 3 3   Afraid - awful might happen 0 0 1 0  Total GAD 7 Score 13 12 14 13   Anxiety Difficulty Somewhat difficult Somewhat difficult Somewhat difficult Somewhat difficult        06/21/2024    9:37 AM 06/26/2023   11:47 AM 04/14/2023   10:36 AM  PHQ9 SCORE ONLY  PHQ-9 Total Score 15 4  14       Data saved with a previous flowsheet row definition     Relevant past medical, surgical, family, and social history reviewed and updated as indicated.  Allergies and medications reviewed and updated. Data reviewed: Chart in Epic.   History reviewed. No pertinent past medical history.  Past Surgical History:  Procedure Laterality Date   LOBECTOMY     RECTAL PROLAPSE REPAIR  06/07/2021    Social History   Socioeconomic History   Marital status: Single    Spouse name: Not on file  Number of children: Not on file   Years of education: Not on file   Highest education level: 12th grade  Occupational History   Not on file  Tobacco Use   Smoking status: Never   Smokeless tobacco: Not on file  Vaping Use   Vaping status: Never Used  Substance and Sexual Activity   Alcohol use: Never   Drug use: Not on file   Sexual activity: Not on file  Other Topics Concern   Not on file  Social History Narrative   Not on file   Social Drivers of Health   Tobacco Use: Unknown (06/21/2024)   Patient History    Smoking Tobacco Use: Never    Smokeless Tobacco Use: Unknown    Passive Exposure: Not on  file  Financial Resource Strain: Low Risk (06/17/2024)   Overall Financial Resource Strain (CARDIA)    Difficulty of Paying Living Expenses: Not hard at all  Food Insecurity: No Food Insecurity (06/17/2024)   Epic    Worried About Radiation Protection Practitioner of Food in the Last Year: Never true    Ran Out of Food in the Last Year: Never true  Transportation Needs: No Transportation Needs (06/17/2024)   Epic    Lack of Transportation (Medical): No    Lack of Transportation (Non-Medical): No  Physical Activity: Inactive (06/17/2024)   Exercise Vital Sign    Days of Exercise per Week: 0 days    Minutes of Exercise per Session: Not on file  Stress: Stress Concern Present (06/17/2024)   [employer] of Occupational Health - Occupational Stress Questionnaire    Feeling of Stress: To some extent  Social Connections: Moderately Isolated (06/17/2024)   Social Connection and Isolation Panel    Frequency of Communication with Friends and Family: More than three times a week    Frequency of Social Gatherings with Friends and Family: Three times a week    Attends Religious Services: Never    Active Member of Clubs or Organizations: No    Attends Banker Meetings: Not on file    Marital Status: Living with partner  Intimate Partner Violence: Not At Risk (03/17/2022)   Received from [facility]   HITS    Over the last 12 months how often did your partner physically hurt you?: Never    Over the last 12 months how often did your partner insult you or talk down to you?: Never    Over the last 12 months how often did your partner threaten you with physical harm?: Never    Over the last 12 months how often did your partner scream or curse at you?: Never  Depression (PHQ2-9): High Risk (06/21/2024)   Depression (PHQ2-9)    PHQ-2 Score: 15  Alcohol Screen: Low Risk (01/29/2023)   Alcohol Screen    Last Alcohol Screening Score (AUDIT): 0  Housing: Unknown (06/17/2024)   Epic    Unable to Pay for  Housing in the Last Year: No    Number of Times Moved in the Last Year: Not on file    Homeless in the Last Year: No  Utilities: Not on file  Health Literacy: Not on file    Outpatient Encounter Medications as of 06/21/2024  Medication Sig   buPROPion  (WELLBUTRIN ) 75 MG tablet Take 1 tablet (75 mg total) by mouth 2 (two) times daily.   busPIRone  (BUSPAR ) 15 MG tablet Take 1 tablet (15 mg total) by mouth 2 (two) times daily.   fluticasone  (FLONASE ) 50 MCG/ACT  nasal spray SPRAY 2 SPRAYS INTO EACH NOSTRIL EVERY DAY   oxybutynin  (DITROPAN -XL) 10 MG 24 hr tablet TAKE 1 TABLET BY MOUTH EVERY DAY   risperiDONE  (RISPERDAL ) 1 MG tablet TAKE 1 TABLET BY MOUTH EVERYDAY AT BEDTIME   [DISCONTINUED] busPIRone  (BUSPAR ) 10 MG tablet TAKE 1 TABLET BY MOUTH TWICE A DAY   [DISCONTINUED] escitalopram  (LEXAPRO ) 20 MG tablet TAKE 1 TABLET BY MOUTH EVERY DAY   No facility-administered encounter medications on file as of 06/21/2024.    Allergies[1]  Pertinent ROS per HPI, otherwise unremarkable      Objective:  BP 100/60   Pulse 62   Temp (!) 97.2 F (36.2 C) (Temporal)   Ht 5' 7 (1.702 m)   Wt 149 lb (67.6 kg)   SpO2 96%   BMI 23.34 kg/m    Wt Readings from Last 3 Encounters:  06/21/24 149 lb (67.6 kg)  04/14/23 140 lb (63.5 kg)  03/12/23 139 lb 9.6 oz (63.3 kg)    Physical Exam Vitals and nursing note reviewed.  Constitutional:      General: She is not in acute distress.    Appearance: Normal appearance.  HENT:     Head: Normocephalic and atraumatic.     Right Ear: Tympanic membrane, ear canal and external ear normal. There is no impacted cerumen.     Left Ear: Tympanic membrane, ear canal and external ear normal. There is no impacted cerumen.     Nose: Nose normal.     Mouth/Throat:     Mouth: Mucous membranes are moist.  Eyes:     General: No scleral icterus.    Extraocular Movements: Extraocular movements intact.     Conjunctiva/sclera: Conjunctivae normal.     Pupils: Pupils  are equal, round, and reactive to light.  Neck:     Vascular: No carotid bruit.  Cardiovascular:     Heart sounds: Normal heart sounds.  Pulmonary:     Effort: Pulmonary effort is normal.     Breath sounds: Normal breath sounds.  Abdominal:     General: Bowel sounds are normal.     Palpations: Abdomen is soft.     Tenderness: There is no abdominal tenderness.  Musculoskeletal:        General: Normal range of motion.     Cervical back: Normal range of motion and neck supple. No rigidity or tenderness.     Right lower leg: No edema.     Left lower leg: No edema.  Lymphadenopathy:     Cervical: No cervical adenopathy.  Skin:    General: Skin is warm and dry.     Findings: No rash.  Neurological:     Mental Status: She is alert and oriented to person, place, and time.  Psychiatric:        Attention and Perception: Attention and perception normal.        Mood and Affect: Mood is anxious and depressed.        Speech: Speech normal.        Behavior: Behavior normal. Behavior is cooperative.        Thought Content: Thought content normal. Thought content does not include homicidal or suicidal ideation. Thought content does not include homicidal or suicidal plan.        Cognition and Memory: Cognition and memory normal.        Judgment: Judgment normal.    Physical Exam       Results for orders placed or performed in visit on 10/31/23  HM COLONOSCOPY   Collection Time: 06/06/21 12:00 AM  Result Value Ref Range   HM Colonoscopy See Report (in chart) See Report (in chart), Patient Reported       Pertinent labs & imaging results that were available during my care of the patient were reviewed by me and considered in my medical decision making.  Assessment & Plan:  Whitney [name] was seen today for annual exam.  Diagnoses and all orders for this visit:  Physical exam -     HIV Antibody (routine testing w rflx) -     CBC with Differential/Platelet -     CMP14+EGFR -     Lipid  panel -     Thyroid  Panel With TSH -     VITAMIN D  25 Hydroxy (Vit-D Deficiency, Fractures) -     Alb+Prl+FSH+LH+Prog+DHEA+Es... -     busPIRone  (BUSPAR ) 15 MG tablet; Take 1 tablet (15 mg total) by mouth 2 (two) times daily. -     buPROPion  (WELLBUTRIN ) 75 MG tablet; Take 1 tablet (75 mg total) by mouth 2 (two) times daily.  Vitamin D  deficiency -     VITAMIN D  25 Hydroxy (Vit-D Deficiency, Fractures)  Moderate episode of recurrent major depressive disorder (HCC) -     Lipid panel -     Thyroid  Panel With TSH -     busPIRone  (BUSPAR ) 15 MG tablet; Take 1 tablet (15 mg total) by mouth 2 (two) times daily. -     buPROPion  (WELLBUTRIN ) 75 MG tablet; Take 1 tablet (75 mg total) by mouth 2 (two) times daily.  Mood swings -     Lipid panel -     Thyroid  Panel With TSH -     busPIRone  (BUSPAR ) 15 MG tablet; Take 1 tablet (15 mg total) by mouth 2 (two) times daily. -     buPROPion  (WELLBUTRIN ) 75 MG tablet; Take 1 tablet (75 mg total) by mouth 2 (two) times daily.  Decreased libido without sexual dysfunction -     Alb+Prl+FSH+LH+Prog+DHEA+Es...     Assessment and Plan This is a 63 year old Caucasian female seen today for annual physical, no acute distress Assessment & Plan Moderate episode of recurrent major depressive disorder Persistent symptoms of crying, fatigue, and insomnia. Current treatment includes Buspirone , Escitalopram , and Risperidone . Escitalopram  may decrease libido. Depression may contribute to weight gain. - Increased Buspirone  to 15 mg three times a day. - Initiated Wellbutrin  75 mg twice a day. - Weaned off Escitalopram  by taking half a tablet for one week while starting Wellbutrin , then discontinue Escitalopram . - Scheduled follow-up in two weeks to assess response to medication changes.  Menopausal syndrome Symptoms include hot flashes, weight gain, and decreased libido. Menopause has lasted for 13 years. Weight gain may be related to depression and age-related  metabolic changes. - Recommended evening primrose oil for nighttime symptoms. - Advised on lifestyle modifications including exercise, brain foods, turmeric, and ginger.  Decreased libido Likely related to menopause. Hormonal changes due to menopause may contribute to decreased libido. - Checked sex hormone levels. - Advised on lifestyle modifications to potentially improve mood and libido.  Vitamin D  deficiency Vitamin D  levels need to be assessed as part of overall health maintenance. - Ordered blood work to check vitamin D  levels.   Labs: CBC, CMP, lipid, HIV, hep C, vitamin D  result pending  Continue all other maintenance medications.  Follow up plan: Return in about 2 weeks (around 07/05/2024) for mental Health.   Continue healthy lifestyle choices, including diet (rich  in fruits, vegetables, and lean proteins, and low in salt and simple carbohydrates) and exercise (at least 30 minutes of moderate physical activity daily).  Educational handout given for   Clinical References  Managing Depression, Adult Depression is a mental health condition that affects your thoughts, feelings, and actions. Being diagnosed with depression can bring you relief if you did not know why you have felt or behaved a certain way. It could also leave you feeling overwhelmed. Finding ways to manage your symptoms can help you feel more positive about your future. How to manage lifestyle changes Being depressed is difficult. Depression can increase the level of everyday stress. Stress can make depression symptoms worse. You may believe your symptoms cannot be managed or will never improve. However, there are many things you can try to help manage your symptoms. There is hope. Managing stress  Stress is your body's reaction to life changes and events, both good and bad. Stress can add to your feelings of depression. Learning to manage your stress can help lessen your feelings of depression. Try some of the  following approaches to reducing your stress (stress reduction techniques): Listen to music that you enjoy and that inspires you. Try using a meditation app or take a meditation class. Develop a practice that helps you connect with your spiritual self. Walk in nature, pray, or go to a place of worship. Practice deep breathing. To do this, inhale slowly through your nose. Pause at the top of your inhale for a few seconds and then exhale slowly, letting yourself relax. Repeat this three or four times. Practice yoga to help relax and work your muscles. Choose a stress reduction technique that works for you. These techniques take time and practice to develop. Set aside 5-15 minutes a day to do them. Therapists can offer training in these techniques. Do these things to help manage stress: Keep a journal. Know your limits. Set healthy boundaries for yourself and others, such as saying no when you think something is too much. Pay attention to how you react to certain situations. You may not be able to control everything, but you can change your reaction. Add humor to your life by watching funny movies or shows. Make time for activities that you enjoy and that relax you. Spend less time using electronics, especially at night before bed. The light from screens can make your brain think it is time to get up rather than go to bed.   Medicines Medicines, such as antidepressants, are often a part of treatment for depression. Talk with your pharmacist or health care provider about all the medicines, supplements, and herbal products that you take, their possible side effects, and what medicines and other products are safe to take together. Make sure to report any side effects you may have to your health care provider. Relationships Your health care provider may suggest family therapy, couples therapy, or individual therapy as part of your treatment. How to recognize changes Everyone responds differently to  treatment for depression. As you recover from depression, you may start to: Have more interest in doing activities. Feel more hopeful. Have more energy. Eat a more regular amount of food. Have better mental focus. It is important to recognize if your depression is not getting better or is getting worse. The symptoms you had in the beginning may return, such as: Feeling tired. Eating too much or too little. Sleeping too much or too little. Feeling restless, agitated, or hopeless. Trouble focusing or making decisions. Having  unexplained aches and pains. Feeling irritable, angry, or aggressive. If you or your family members notice these symptoms coming back, let your health care provider know right away. Follow these instructions at home: Activity Try to get some form of exercise each day, such as walking. Try yoga, mindfulness, or other stress reduction techniques. Participate in group activities if you are able. Lifestyle Get enough sleep. Cut down on or stop using caffeine, tobacco, alcohol, and any other harmful substances. Eat a healthy diet that includes plenty of vegetables, fruits, whole grains, low-fat dairy products, and lean protein. Limit foods that are high in solid fats, added sugar, or salt (sodium). General instructions Take over-the-counter and prescription medicines only as told by your health care provider. Keep all follow-up visits. It is important for your health care provider to check on your mood, behavior, and medicines. Your health care provider may need to make changes to your treatment. Where to find support Talking to others  Friends and family members can be sources of support and guidance. Talk to trusted friends or family members about your condition. Explain your symptoms and let them know that you are working with a health care provider to treat your depression. Tell friends and family how they can help. Finances Find mental health providers that fit with  your financial situation. Talk with your health care provider if you are worried about access to food, housing, or medicine. Call your insurance company to learn about your co-pays and prescription plan. Where to find more information You can find support in your area from: Anxiety and Depression Association of America (ADAA): adaa.org Mental Health [place]: mentalhealthamerica.net [employer] on Mental Illness: nami.org Contact a health care provider if: You stop taking your antidepressant medicines, and you have any of these symptoms: Nausea. Headache. Light-headedness. Chills and body aches. Not being able to sleep (insomnia). You or your friends and family think your depression is getting worse. Get help right away if: You have thoughts of hurting yourself or others. Get help right away if you feel like you may hurt yourself or others, or have thoughts about taking your own life. Go to your nearest emergency room or: Call 911. Call the National Suicide Prevention Lifeline at (518) 592-5328 or 988. This is open 24 hours a day. Text the Crisis Text Line at 9011186761. This information is not intended to replace advice given to you by your health care provider. Make sure you discuss any questions you have with your health care provider. Document Revised: 10/30/2021 Document Reviewed: 10/30/2021 Elsevier Patient Education  2024 Elsevier Inc. Health Maintenance, Female Adopting a healthy lifestyle and getting preventive care are important in promoting health and wellness. Ask your health care provider about: The right schedule for you to have regular tests and exams. Things you can do on your own to prevent diseases and keep yourself healthy. What should I know about diet, weight, and exercise? Eat a healthy diet  Eat a diet that includes plenty of vegetables, fruits, low-fat dairy products, and lean protein. Do not eat a lot of foods that are high in solid fats, added sugars, or  sodium. Maintain a healthy weight Body mass index (BMI) is used to identify weight problems. It estimates body fat based on height and weight. Your health care provider can help determine your BMI and help you achieve or maintain a healthy weight. Get regular exercise Get regular exercise. This is one of the most important things you can do for your health. Most adults  should: Exercise for at least 150 minutes each week. The exercise should increase your heart rate and make you sweat (moderate-intensity exercise). Do strengthening exercises at least twice a week. This is in addition to the moderate-intensity exercise. Spend less time sitting. Even light physical activity can be beneficial. Watch cholesterol and blood lipids Have your blood tested for lipids and cholesterol at 63 years of age, then have this test every 5 years. Have your cholesterol levels checked more often if: Your lipid or cholesterol levels are high. You are older than 63 years of age. You are at high risk for heart disease. What should I know about cancer screening? Depending on your health history and family history, you may need to have cancer screening at various ages. This may include screening for: Breast cancer. Cervical cancer. Colorectal cancer. Skin cancer. Lung cancer. What should I know about heart disease, diabetes, and high blood pressure? Blood pressure and heart disease High blood pressure causes heart disease and increases the risk of stroke. This is more likely to develop in people who have high blood pressure readings or are overweight. Have your blood pressure checked: Every 3-5 years if you are 45-98 years of age. Every year if you are 62 years old or older. Diabetes Have regular diabetes screenings. This checks your fasting blood sugar level. Have the screening done: Once every three years after age 35 if you are at a normal weight and have a low risk for diabetes. More often and at a younger  age if you are overweight or have a high risk for diabetes. What should I know about preventing infection? Hepatitis B If you have a higher risk for hepatitis B, you should be screened for this virus. Talk with your health care provider to find out if you are at risk for hepatitis B infection. Hepatitis C Testing is recommended for: Everyone born from [birth year] through 1965. Anyone with known risk factors for hepatitis C. Sexually transmitted infections (STIs) Get screened for STIs, including gonorrhea and chlamydia, if: You are sexually active and are younger than 63 years of age. You are older than 63 years of age and your health care provider tells you that you are at risk for this type of infection. Your sexual activity has changed since you were last screened, and you are at increased risk for chlamydia or gonorrhea. Ask your health care provider if you are at risk. Ask your health care provider about whether you are at high risk for HIV. Your health care provider may recommend a prescription medicine to help prevent HIV infection. If you choose to take medicine to prevent HIV, you should first get tested for HIV. You should then be tested every 3 months for as long as you are taking the medicine. Pregnancy If you are about to stop having your period (premenopausal) and you may become pregnant, seek counseling before you get pregnant. Take 400 to 800 micrograms (mcg) of folic acid every day if you become pregnant. Ask for birth control (contraception) if you want to prevent pregnancy. Osteoporosis and menopause Osteoporosis is a disease in which the bones lose minerals and strength with aging. This can result in bone fractures. If you are 63 years old or older, or if you are at risk for osteoporosis and fractures, ask your health care provider if you should: Be screened for bone loss. Take a calcium or vitamin D  supplement to lower your risk of fractures. Be given hormone replacement therapy  (HRT) to treat  symptoms of menopause. Follow these instructions at home: Alcohol use Do not drink alcohol if: Your health care provider tells you not to drink. You are pregnant, may be pregnant, or are planning to become pregnant. If you drink alcohol: Limit how much you have to: 0-1 drink a day. Know how much alcohol is in your drink. In the [place] one drink equals one 12 oz bottle of beer (355 mL), one 5 oz glass of wine (148 mL), or one 1 oz glass of hard liquor (44 mL). Lifestyle Do not use any products that contain nicotine or tobacco. These products include cigarettes, chewing tobacco, and vaping devices, such as e-cigarettes. If you need help quitting, ask your health care provider. Do not use street drugs. Do not share needles. Ask your health care provider for help if you need support or information about quitting drugs. General instructions Schedule regular health, dental, and eye exams. Stay current with your vaccines. Tell your health care provider if: You often feel depressed. You have ever been abused or do not feel safe at home. Summary Adopting a healthy lifestyle and getting preventive care are important in promoting health and wellness. Follow your health care provider's instructions about healthy diet, exercising, and getting tested or screened for diseases. Follow your health care provider's instructions on monitoring your cholesterol and blood pressure. This information is not intended to replace advice given to you by your health care provider. Make sure you discuss any questions you have with your health care provider. Document Revised: 11/13/2020 Document Reviewed: 11/13/2020 Elsevier Patient Education  2024 [employer] Vitamin D  Deficiency: What to Know Vitamin D  deficiency is when your body doesn't have enough vitamin D . Vitamin D  is important because: It helps the body to maintain calcium and phosphorus levels. It helps to keep your bones healthy. Not  getting enough vitamin D  can make your bones soft. It reduces irritation or inflammation in the body. It helps the body's defense system (immune system) work better. What are the causes? Not eating enough foods that have vitamin D  in them. Not getting enough sun. Having diseases that make it hard for your body to take in vitamin D . Having had part of your stomach or part of your small intestine taken out. What increases the risk? Being an older adult. Not spending much time outdoors. Living in a long-term care center. Having dark skin. Taking certain medicines, such as steroids or seizure medicines. Being overweight or very overweight (obese). Having long-term (chronic) kidney or liver disease. What are the signs or symptoms? Pain in the bones. Pain in the muscles. Not being able to walk normally. Bones that break easily. Joint pain. How is this diagnosed? This condition may be diagnosed with blood tests. Imaging tests such as X-rays may also be done to look for weakness in the bone. How is this treated? Treatment may include taking supplements. You may be told to take vitamin D  or calcium.  Your health care provider will tell you what dose is best for you. Follow these instructions at home: Eating and drinking Eat foods that contain vitamin D , such as: Dairy products, cereals, or juices that have vitamin D  added to them. Check the label on the package to see if vitamin D  was added to your food. Fish, such as salmon or trout. Eggs. The vitamin D  is in the yolk. Mushrooms that were treated with UV light. Beef liver. The items listed above may not be all the foods and drinks that have vitamin  D. Talk with a dietitian to learn more. General instructions Take your medicines only as told. Take supplements only as told. Get regular, safe exposure to natural sunlight. Do not use a tanning bed. Maintain a healthy weight. Lose weight if needed. Contact a health care provider  if: Your symptoms do not go away. You throw up or feel like you may throw up. You have trouble pooping (constipation), or you poop less than usual. This information is not intended to replace advice given to you by your health care provider. Make sure you discuss any questions you have with your health care provider. Document Revised: 11/10/2023 Document Reviewed: 09/12/2023 Elsevier Patient Education  2025 [employer].  The above assessment and management plan was discussed with the patient. The patient verbalized understanding of and has agreed to the management plan. Patient is aware to call the clinic if they develop any new symptoms or if symptoms persist or worsen. Patient is aware when to return to the clinic for a follow-up visit. Patient educated on when it is appropriate to go to the emergency department.   Adamaris King [place] Thompson, DNP [facility] [address] [place], [place] [address] [phone]      [1] No Known Allergies

## 2024-06-21 ENCOUNTER — Encounter: Payer: Self-pay | Admitting: Nurse Practitioner

## 2024-06-21 ENCOUNTER — Ambulatory Visit: Payer: Self-pay | Admitting: Nurse Practitioner

## 2024-06-21 VITALS — BP 100/60 | HR 62 | Temp 97.2°F | Ht 67.0 in | Wt 149.0 lb

## 2024-06-21 DIAGNOSIS — R6882 Decreased libido: Secondary | ICD-10-CM | POA: Insufficient documentation

## 2024-06-21 DIAGNOSIS — Z0000 Encounter for general adult medical examination without abnormal findings: Secondary | ICD-10-CM | POA: Diagnosis not present

## 2024-06-21 DIAGNOSIS — F331 Major depressive disorder, recurrent, moderate: Secondary | ICD-10-CM

## 2024-06-21 DIAGNOSIS — Z820 Family history of epilepsy and other diseases of the nervous system: Secondary | ICD-10-CM | POA: Diagnosis not present

## 2024-06-21 DIAGNOSIS — Z0001 Encounter for general adult medical examination with abnormal findings: Secondary | ICD-10-CM | POA: Diagnosis not present

## 2024-06-21 DIAGNOSIS — E559 Vitamin D deficiency, unspecified: Secondary | ICD-10-CM | POA: Diagnosis not present

## 2024-06-21 DIAGNOSIS — R4586 Emotional lability: Secondary | ICD-10-CM | POA: Diagnosis not present

## 2024-06-21 MED ORDER — BUSPIRONE HCL 15 MG PO TABS: 15.0000 mg | ORAL_TABLET | Freq: Two times a day (BID) | ORAL | 1 refills | Status: AC

## 2024-06-21 MED ORDER — BUPROPION HCL 75 MG PO TABS: 75.0000 mg | ORAL_TABLET | Freq: Two times a day (BID) | ORAL | 0 refills | Status: DC

## 2024-06-21 NOTE — Patient Instructions (Signed)
 evening primrose oil

## 2024-06-25 LAB — ALB+PRL+FSH+LH+PROG+DHEA+ES...
Dehydroepiandrosterone: 372 ng/dL (ref 21–402)
Estrogen: 99 pg/mL (ref 40–244)
FSH: 72.1 m[IU]/mL (ref 25.8–134.8)
LH: 36.8 m[IU]/mL (ref 7.7–58.5)
Progesterone: 0.3 ng/mL
Prolactin: 28 ng/mL — ABNORMAL HIGH (ref 3.6–25.2)
Sex Hormone Binding: 122 nmol/L (ref 17.3–125.0)
Vit D, 25-Hydroxy: 65.6 ng/mL (ref 30.0–100.0)

## 2024-06-25 LAB — CMP14+EGFR
ALT: 30 IU/L (ref 0–32)
AST: 31 IU/L (ref 0–40)
Albumin: 4.3 g/dL (ref 3.9–4.9)
Alkaline Phosphatase: 70 IU/L (ref 49–135)
BUN/Creatinine Ratio: 11 — ABNORMAL LOW (ref 12–28)
BUN: 8 mg/dL (ref 8–27)
Bilirubin Total: 0.3 mg/dL (ref 0.0–1.2)
CO2: 24 mmol/L (ref 20–29)
Calcium: 9.9 mg/dL (ref 8.7–10.3)
Chloride: 100 mmol/L (ref 96–106)
Creatinine, Ser: 0.7 mg/dL (ref 0.57–1.00)
Globulin, Total: 2.5 g/dL (ref 1.5–4.5)
Glucose: 82 mg/dL (ref 70–99)
Potassium: 5 mmol/L (ref 3.5–5.2)
Sodium: 140 mmol/L (ref 134–144)
Total Protein: 6.8 g/dL (ref 6.0–8.5)
eGFR: 97 mL/min/1.73 (ref >59)

## 2024-06-25 LAB — CBC WITH DIFFERENTIAL/PLATELET
Basophils Absolute: 0 x10E3/uL (ref 0.0–0.2)
Basos: 1 %
EOS (ABSOLUTE): 0.3 x10E3/uL (ref 0.0–0.4)
Eos: 4 %
Hematocrit: 41.7 % (ref 34.0–46.6)
Hemoglobin: 13.5 g/dL (ref 11.1–15.9)
Immature Grans (Abs): 0 x10E3/uL (ref 0.0–0.1)
Immature Granulocytes: 0 %
Lymphocytes Absolute: 3.5 x10E3/uL — ABNORMAL HIGH (ref 0.7–3.1)
Lymphs: 46 %
MCH: 30.3 pg (ref 26.6–33.0)
MCHC: 32.4 g/dL (ref 31.5–35.7)
MCV: 94 fL (ref 79–97)
Monocytes Absolute: 0.8 x10E3/uL (ref 0.1–0.9)
Monocytes: 10 %
Neutrophils Absolute: 3 x10E3/uL (ref 1.4–7.0)
Neutrophils: 39 %
Platelets: 441 x10E3/uL (ref 150–450)
RBC: 4.46 x10E6/uL (ref 3.77–5.28)
RDW: 12.4 % (ref 11.7–15.4)
WBC: 7.7 x10E3/uL (ref 3.4–10.8)

## 2024-06-25 LAB — HIV ANTIBODY (ROUTINE TESTING W REFLEX): HIV Screen 4th Generation wRfx: NONREACTIVE

## 2024-06-25 LAB — THYROID PANEL WITH TSH
Free Thyroxine Index: 2.2 (ref 1.2–4.9)
T3 Uptake Ratio: 28 % (ref 24–39)
T4, Total: 8 ug/dL (ref 4.5–12.0)
TSH: 1.31 u[IU]/mL (ref 0.450–4.500)

## 2024-06-25 LAB — LIPID PANEL
Chol/HDL Ratio: 2.7 ratio (ref 0.0–4.4)
Cholesterol, Total: 193 mg/dL (ref 100–199)
HDL: 71 mg/dL (ref >39)
LDL Chol Calc (NIH): 106 mg/dL — ABNORMAL HIGH (ref 0–99)
Triglycerides: 88 mg/dL (ref 0–149)
VLDL Cholesterol Cal: 16 mg/dL (ref 5–40)

## 2024-06-28 ENCOUNTER — Inpatient Hospital Stay
Admission: RE | Admit: 2024-06-28 | Discharge: 2024-06-28 | Attending: Nurse Practitioner | Admitting: Nurse Practitioner

## 2024-06-28 ENCOUNTER — Ambulatory Visit: Payer: Self-pay | Admitting: Nurse Practitioner

## 2024-06-28 DIAGNOSIS — Z1231 Encounter for screening mammogram for malignant neoplasm of breast: Secondary | ICD-10-CM

## 2024-06-28 MED ORDER — ARIPIPRAZOLE 2 MG PO TABS: 2.0000 mg | ORAL_TABLET | Freq: Every day | ORAL | 0 refills | Status: DC

## 2024-07-02 ENCOUNTER — Telehealth: Payer: Self-pay | Admitting: Nurse Practitioner

## 2024-07-02 ENCOUNTER — Other Ambulatory Visit: Payer: Self-pay

## 2024-07-02 MED ORDER — ESCITALOPRAM OXALATE 20 MG PO TABS: 20.0000 mg | ORAL_TABLET | Freq: Every day | ORAL | 0 refills | Status: DC

## 2024-07-02 NOTE — Telephone Encounter (Signed)
 Patient was called and information is in a fpl group. Please see other encounter

## 2024-07-02 NOTE — Telephone Encounter (Signed)
 Patient returned called to office stating that she would like another rx for lexapro  sent to pharmacy because she doesn't think 1 week was enough to wean off of med. She states that she had 20mg  tablets and was breaking them in half and would like additional rx so she can try for another week or so. Refill of lexapro  sent to pharmacy per patients request

## 2024-07-02 NOTE — Telephone Encounter (Signed)
Please see telephone encounter for follow up

## 2024-07-02 NOTE — Telephone Encounter (Signed)
 Pt called in and said Whitney [name] changed her medication from lexapro  to wellbutrin  and it is making her dizzy. She would like to speak with nurse

## 2024-07-12 ENCOUNTER — Telehealth: Admitting: Nurse Practitioner

## 2024-07-12 DIAGNOSIS — R4586 Emotional lability: Secondary | ICD-10-CM

## 2024-07-12 DIAGNOSIS — F331 Major depressive disorder, recurrent, moderate: Secondary | ICD-10-CM

## 2024-07-12 MED ORDER — ARIPIPRAZOLE 2 MG PO TABS: 2.0000 mg | ORAL_TABLET | Freq: Every day | ORAL | 0 refills | Status: DC

## 2024-07-12 MED ORDER — BUPROPION HCL ER (XL) 150 MG PO TB24: 150.0000 mg | ORAL_TABLET | Freq: Every day | ORAL | 0 refills | Status: DC

## 2024-07-12 MED ORDER — ESCITALOPRAM OXALATE 5 MG PO TABS: 5.0000 mg | ORAL_TABLET | Freq: Every day | ORAL | 0 refills | Status: DC

## 2024-07-12 NOTE — Progress Notes (Signed)
 "  Virtual Visit via video Note Due to COVID-19 pandemic this visit was conducted virtually. This visit type was conducted due to national recommendations for restrictions regarding the COVID-19 Pandemic (e.g. social distancing, sheltering in place) in an effort to limit this patient's exposure and mitigate transmission in our community. All issues noted in this document were discussed and addressed.  A physical exam was not performed with this format.   I connected with Whitney [name] on 07/12/2024 at 1110 by name and DOB and verified that I am speaking with the correct person using two identifiers. Whitney [name] is currently located at home during visit. The provider, Nena Deitra Morton Sebastian, DNP is located in their office at time of visit.  I discussed the limitations, risks, security and privacy concerns of performing an evaluation and management service by virtual visit and the availability of in person appointments. I also discussed with the patient that there may be a patient responsible charge related to this service. The patient expressed understanding and agreed to proceed.  Subjective:  Patient ID: Whitney [name], female    DOB: [birth year], 64 y.o.   MRN: [id number]  Chief Complaint:  Depression ( She )   HPI: Whitney [name] is a 64 y.o. female presenting on 07/12/2024 for Depression ( She )   The patient was last seen on 06/21/2024 for management of depression and mood swings. At that visit, Lamotrigine (Lamictal) was increased to 50 mg three times daily, Bupropion  (Wellbutrin ) immediate-release 75 mg twice daily was initiated, and Escitalopram  (Lexapro ) was started on a taper with plans for discontinuation. She was also taking Risperidone  at that time.  Subsequent laboratory results revealed an elevated prolactin level, likely secondary to risperidone . The patient was contacted on 07/01/2024 to review results, and risperidone  was discontinued. She was transitioned to Aripiprazole  (Abilify ) 2 mg  daily.  The patient reports difficulty with rapid discontinuation of escitalopram  and contacted the clinic while the provider was on vacation requesting reinstatement due to worsening symptoms. She is currently taking Escitalopram  (Lexapro ) 15 mg daily, Lamotrigine 75 mg twice daily, Aripiprazole  2 mg daily, and Bupropion . She reports improved sleep, stable mood, and denies significant depressive or mood instability symptoms at this time. She would like to continue bupropion  but is agreeable to dose adjustment.    Relevant past medical, surgical, family, and social history reviewed and updated as indicated.  Allergies and medications reviewed and updated.   No past medical history on file.  Past Surgical History:  Procedure Laterality Date   LOBECTOMY     RECTAL PROLAPSE REPAIR  06/07/2021    Social History   Socioeconomic History   Marital status: Single    Spouse name: Not on file   Number of children: Not on file   Years of education: Not on file   Highest education level: 12th grade  Occupational History   Not on file  Tobacco Use   Smoking status: Never   Smokeless tobacco: Not on file  Vaping Use   Vaping status: Never Used  Substance and Sexual Activity   Alcohol use: Never   Drug use: Not on file   Sexual activity: Not on file  Other Topics Concern   Not on file  Social History Narrative   Not on file   Social Drivers of Health   Tobacco Use: Unknown (06/21/2024)   Patient History    Smoking Tobacco Use: Never    Smokeless Tobacco Use: Unknown    Passive Exposure:  Not on file  Financial Resource Strain: Low Risk (06/17/2024)   Overall Financial Resource Strain (CARDIA)    Difficulty of Paying Living Expenses: Not hard at all  Food Insecurity: No Food Insecurity (06/17/2024)   Epic    Worried About Programme Researcher, Broadcasting/film/video in the Last Year: Never true    Ran Out of Food in the Last Year: Never true  Transportation Needs: No Transportation Needs (06/17/2024)    Epic    Lack of Transportation (Medical): No    Lack of Transportation (Non-Medical): No  Physical Activity: Inactive (06/17/2024)   Exercise Vital Sign    Days of Exercise per Week: 0 days    Minutes of Exercise per Session: Not on file  Stress: Stress Concern Present (06/17/2024)   [employer] of Occupational Health - Occupational Stress Questionnaire    Feeling of Stress: To some extent  Social Connections: Moderately Isolated (06/17/2024)   Social Connection and Isolation Panel    Frequency of Communication with Friends and Family: More than three times a week    Frequency of Social Gatherings with Friends and Family: Three times a week    Attends Religious Services: Never    Active Member of Clubs or Organizations: No    Attends Banker Meetings: Not on file    Marital Status: Living with partner  Intimate Partner Violence: Not At Risk (03/17/2022)   Received from [facility]   HITS    Over the last 12 months how often did your partner physically hurt you?: Never    Over the last 12 months how often did your partner insult you or talk down to you?: Never    Over the last 12 months how often did your partner threaten you with physical harm?: Never    Over the last 12 months how often did your partner scream or curse at you?: Never  Depression (PHQ2-9): High Risk (06/21/2024)   Depression (PHQ2-9)    PHQ-2 Score: 15  Alcohol Screen: Low Risk (01/29/2023)   Alcohol Screen    Last Alcohol Screening Score (AUDIT): 0  Housing: Unknown (06/17/2024)   Epic    Unable to Pay for Housing in the Last Year: No    Number of Times Moved in the Last Year: Not on file    Homeless in the Last Year: No  Utilities: Not on file  Health Literacy: Not on file    Outpatient Encounter Medications as of 07/12/2024  Medication Sig   buPROPion  (WELLBUTRIN  XL) 150 MG 24 hr tablet Take 1 tablet (150 mg total) by mouth daily.   escitalopram  (LEXAPRO ) 5 MG tablet Take 1 tablet (5  mg total) by mouth daily.   ARIPiprazole  (ABILIFY ) 2 MG tablet Take 1 tablet (2 mg total) by mouth daily.   busPIRone  (BUSPAR ) 15 MG tablet Take 1 tablet (15 mg total) by mouth 2 (two) times daily.   fluticasone  (FLONASE ) 50 MCG/ACT nasal spray SPRAY 2 SPRAYS INTO EACH NOSTRIL EVERY DAY   oxybutynin  (DITROPAN -XL) 10 MG 24 hr tablet TAKE 1 TABLET BY MOUTH EVERY DAY   [DISCONTINUED] ARIPiprazole  (ABILIFY ) 2 MG tablet Take 1 tablet (2 mg total) by mouth daily.   [DISCONTINUED] buPROPion  (WELLBUTRIN ) 75 MG tablet Take 1 tablet (75 mg total) by mouth 2 (two) times daily.   [DISCONTINUED] escitalopram  (LEXAPRO ) 20 MG tablet Take 1 tablet (20 mg total) by mouth daily.   No facility-administered encounter medications on file as of 07/12/2024.    Allergies[1]  Review of  Systems  Constitutional:  Negative for chills and fatigue.  HENT:  Negative for dental problem and sneezing.   Respiratory:  Negative for cough, chest tightness and wheezing.   Cardiovascular:  Negative for chest pain and palpitations.  Neurological:  Negative for dizziness and headaches.  Psychiatric/Behavioral:  Negative for agitation, behavioral problems, sleep disturbance and suicidal ideas.      Observations/Objective: No vital signs or physical exam, this was a virtual health encounter.  Pt alert and oriented, answers all questions appropriately, and able to speak in full sentences.   Physical Exam Constitutional:      General: She is not in acute distress. HENT:     Head: Normocephalic and atraumatic.  Pulmonary:     Effort: Pulmonary effort is normal.  Neurological:     Mental Status: She is alert and oriented to person, place, and time.  Psychiatric:        Attention and Perception: Attention and perception normal.        Mood and Affect: Mood and affect normal.        Speech: Speech normal.        Behavior: Behavior normal. Behavior is cooperative.        Thought Content: Thought content normal. Thought  content does not include homicidal or suicidal ideation. Thought content does not include homicidal or suicidal plan.        Cognition and Memory: Cognition and memory normal.     Assessment and Plan: Whitney [name] was seen today for depression.  Diagnoses and all orders for this visit:  Mood swings -     ARIPiprazole  (ABILIFY ) 2 MG tablet; Take 1 tablet (2 mg total) by mouth daily.  Moderate episode of recurrent major depressive disorder (HCC) -     buPROPion  (WELLBUTRIN  XL) 150 MG 24 hr tablet; Take 1 tablet (150 mg total) by mouth daily. -     escitalopram  (LEXAPRO ) 5 MG tablet; Take 1 tablet (5 mg total) by mouth daily.     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  Patient tolerated reinstatement well with improved mood stability.  Aripiprazole  (Abilify ): Continue 2 mg orally once daily as an alternative to risperidone ; monitor for efficacy and side effects.  Bupropion  (Wellbutrin ): -Discontinue immediate-release formulation. -Initiate Bupropion  extended-release (Wellbutrin  XL) 150 mg orally once daily (dose adjusted for tolerability and adherence).  Labs: No repeat prolactin indicated at this time unless symptoms recur.  Safety: Patient denies suicidal or homicidal ideation. No psychotic symptoms reported.  Follow-up: Return to clinic in 3-weeks for medication response and mood reassessment or sooner if symptoms worsen.  Follow Up Instructions: Return in about 3 weeks (around 08/02/2024) for Telehealth.    I discussed the assessment and treatment plan with the patient. The patient was provided an opportunity to ask questions and all were answered. The patient agreed with the plan and  demonstrated an understanding of the instructions.   The patient was advised to call back or seek an in-person evaluation if the symptoms worsen or if the condition fails to improve as anticipated.  The above assessment and management plan was discussed with the patient. The patient verbalized understanding of and has agreed to the management plan. Patient is aware to call the clinic if they develop any new symptoms or  if symptoms persist or worsen. Patient is aware when to return to the clinic for a follow-up visit. Patient educated on when it is appropriate to go to the emergency department.     Clinical References  Managing Depression, Adult Depression is a mental health condition that affects your thoughts, feelings, and actions. Being diagnosed with depression can bring you relief if you did not know why you have felt or behaved a certain way. It could also leave you feeling overwhelmed. Finding ways to manage your symptoms can help you feel more positive about your future. How to manage lifestyle changes Being depressed is difficult. Depression can increase the level of everyday stress. Stress can make depression symptoms worse. You may believe your symptoms cannot be managed or will never improve. However, there are many things you can try to help manage your symptoms. There is hope. Managing stress  Stress is your body's reaction to life changes and events, both good and bad. Stress can add to your feelings of depression. Learning to manage your stress can help lessen your feelings of depression. Try some of the following approaches to reducing your stress (stress reduction techniques): Listen to music that you enjoy and that inspires you. Try using a meditation app or take a meditation class. Develop a practice that helps you connect with your spiritual self. Walk in nature, pray, or go to a place of worship. Practice deep breathing. To do this, inhale slowly through your nose. Pause at  the top of your inhale for a few seconds and then exhale slowly, letting yourself relax. Repeat this three or four times. Practice yoga to help relax and work your muscles. Choose a stress reduction technique that works for you. These techniques take time and practice to develop. Set aside 5-15 minutes a day to do them. Therapists can offer training in these techniques. Do these things to help manage stress: Keep a journal. Know your limits. Set healthy boundaries for yourself and others, such as saying no when you think something is too much. Pay attention to how you react to certain situations. You may not be able to control everything, but you can change your reaction. Add humor to your life by watching funny movies or shows. Make time for activities that you enjoy and that relax you. Spend less time using electronics, especially at night before bed. The light from screens can make your brain think it is time to get up rather than go to bed.   Medicines Medicines, such as antidepressants, are often a part of treatment for depression. Talk with your pharmacist or health care provider about all the medicines, supplements, and herbal products that you take, their possible side effects, and what medicines and other products are safe to take together. Make sure to report any side effects you may have to your health care provider. Relationships Your health care provider may suggest family therapy, couples therapy, or individual therapy as part of your treatment. How to recognize changes Everyone responds differently to treatment for depression. As you recover from depression, you may start to: Have more interest in doing activities. Feel more hopeful. Have more energy. Eat a more regular amount of food. Have better mental focus. It is important to recognize if your depression is not getting better or is getting worse. The symptoms you had in the beginning may return, such as: Feeling  tired. Eating too much or too little. Sleeping too much or too little. Feeling restless, agitated, or hopeless. Trouble focusing  or making decisions. Having unexplained aches and pains. Feeling irritable, angry, or aggressive. If you or your family members notice these symptoms coming back, let your health care provider know right away. Follow these instructions at home: Activity Try to get some form of exercise each day, such as walking. Try yoga, mindfulness, or other stress reduction techniques. Participate in group activities if you are able. Lifestyle Get enough sleep. Cut down on or stop using caffeine, tobacco, alcohol, and any other harmful substances. Eat a healthy diet that includes plenty of vegetables, fruits, whole grains, low-fat dairy products, and lean protein. Limit foods that are high in solid fats, added sugar, or salt (sodium). General instructions Take over-the-counter and prescription medicines only as told by your health care provider. Keep all follow-up visits. It is important for your health care provider to check on your mood, behavior, and medicines. Your health care provider may need to make changes to your treatment. Where to find support Talking to others  Friends and family members can be sources of support and guidance. Talk to trusted friends or family members about your condition. Explain your symptoms and let them know that you are working with a health care provider to treat your depression. Tell friends and family how they can help. Finances Find mental health providers that fit with your financial situation. Talk with your health care provider if you are worried about access to food, housing, or medicine. Call your insurance company to learn about your co-pays and prescription plan. Where to find more information You can find support in your area from: Anxiety and Depression Association of America (ADAA): adaa.org Mental Health America:  mentalhealthamerica.net [employer] on Mental Illness: nami.org Contact a health care provider if: You stop taking your antidepressant medicines, and you have any of these symptoms: Nausea. Headache. Light-headedness. Chills and body aches. Not being able to sleep (insomnia). You or your friends and family think your depression is getting worse. Get help right away if: You have thoughts of hurting yourself or others. Get help right away if you feel like you may hurt yourself or others, or have thoughts about taking your own life. Go to your nearest emergency room or: Call 911. Call the National Suicide Prevention Lifeline at (437) 290-1305 or 988. This is open 24 hours a day. Text the Crisis Text Line at (870)680-2273. This information is not intended to replace advice given to you by your health care provider. Make sure you discuss any questions you have with your health care provider. Document Revised: 10/30/2021 Document Reviewed: 10/30/2021 Elsevier Patient Education  2024 [employer]. Feeling Very Unhappy, Dissatisfied, or Uneasy (Dysphoria): What to Do Dysphoria is when someone has very strong feelings of uneasiness, unhappiness, or dissatisfaction. It can also involve feeling sad, worried, or easily annoyed. A person may have symptoms of dysphoria because of many reasons. For example, it may be caused by stressful events or situations, even when the event or situation is expected or known.  If symptoms of dysphoria last longer than two weeks, it's important to talk to a health care professional because the symptoms may be caused by a mental health condition, such as an adjustment disorder, depression, or anxiety disorder. Follow these instructions at home:  Tips and recommendations The following tips may help with managing feelings of dysphoria: Take care of yourself. Eat a healthy diet. Exercise regularly. For example, aim for about 20 minutes of physical activity every  day. Make time to do things that you enjoy. Get  enough sleep. Doing certain things can help with getting enough sleep. You can: Turn off screens an hour or so before you get ready for sleep. Have a comfortable temperature in the room for sleeping. Do your best to have a regular time for going to bed and waking up. Avoid using substances to cope, including alcohol, nicotine, or other drugs. Talk about your feelings with people you trust, like loved ones, family, or friends. Learn about things that can help with coping and reducing stress, such as yoga and meditation. Try to do them often. General instructions Take your medicines only as told. Check with your provider before taking any herbs or supplements. Where to find support Your provider. Loved ones, family, or trusted friends. Contact the NAMI HelpLine. Available Monday- Friday, 10 a.m. - 10 p.m. ET. Call 1-800-950-NAMI 3087826512). Text HelpLine to 272 541 9933. Email: helpline@nami .org. Where to find more information For information about living with mental health conditions visit: www.nami.org Contact a health care provider if: Your symptoms are not getting better or have lasted longer than 2 weeks. Your symptoms are getting worse. You start to have symptoms you haven't had before, and the symptoms worry you. Get help right away if: You feel like you may hurt yourself or others. You have thoughts about taking your own life. You have other thoughts or feelings that worry you. These symptoms may be an emergency. Take one of these steps right away: Go to your nearest emergency room. Call 911. Contact The 988 Suicide & Crisis Lifeline (24 hours a day, 7 days a week, free and confidential): Call or text 988. Chat online at chat.newsactor.se. For Veterans and their loved ones, contact the [employer]: Call 988 and press 1. Text (202)370-2945. Chat online at veteranscrisisline.net This information is not intended to replace advice  given to you by your health care provider. Make sure you discuss any questions you have with your health care provider. Document Revised: 07/17/2023 Document Reviewed: 07/17/2023 Elsevier Patient Education  2025 [employer].  I provided 15 minutes of time during this video encounter.   Sheniece Ruggles [place] Thompson, DNP [facility] [address] [place], [place] [address] [phone] 07/12/2024     [1] No Known Allergies  "

## 2024-07-16 ENCOUNTER — Other Ambulatory Visit: Payer: Self-pay | Admitting: Nurse Practitioner

## 2024-07-16 DIAGNOSIS — F331 Major depressive disorder, recurrent, moderate: Secondary | ICD-10-CM

## 2024-07-28 ENCOUNTER — Other Ambulatory Visit: Payer: Self-pay | Admitting: Nurse Practitioner

## 2024-08-02 ENCOUNTER — Encounter: Payer: Self-pay | Admitting: Nurse Practitioner

## 2024-08-02 ENCOUNTER — Telehealth (INDEPENDENT_AMBULATORY_CARE_PROVIDER_SITE_OTHER): Admitting: Nurse Practitioner

## 2024-08-02 DIAGNOSIS — R4586 Emotional lability: Secondary | ICD-10-CM | POA: Diagnosis not present

## 2024-08-02 DIAGNOSIS — F331 Major depressive disorder, recurrent, moderate: Secondary | ICD-10-CM | POA: Diagnosis not present

## 2024-08-02 DIAGNOSIS — G4709 Other insomnia: Secondary | ICD-10-CM | POA: Diagnosis not present

## 2024-08-02 MED ORDER — ARIPIPRAZOLE 2 MG PO TABS: 2.0000 mg | ORAL_TABLET | Freq: Every day | ORAL | 0 refills | Status: AC

## 2024-08-02 MED ORDER — BUPROPION HCL ER (XL) 150 MG PO TB24: 150.0000 mg | ORAL_TABLET | Freq: Every day | ORAL | 0 refills | Status: AC

## 2024-08-02 NOTE — Progress Notes (Signed)
 "  Virtual Visit via video Note Due to COVID-19 pandemic this visit was conducted virtually. This visit type was conducted due to national recommendations for restrictions regarding the COVID-19 Pandemic (e.g. social distancing, sheltering in place) in an effort to limit this patient's exposure and mitigate transmission in our community. All issues noted in this document were discussed and addressed.  A physical exam was not performed with this format.     I connected with Whitney [name] on 08/02/2024 at 0940 by name and DOB and verified that I am speaking with the correct person using two identifiers. Whitney [name] is currently located at home  during visit. The provider, Nena Deitra Morton Sebastian, DNP is located in their home at time of visit.  I discussed the limitations, risks, security and privacy concerns of performing an evaluation and management service by virtual visit and the availability of in person appointments. I also discussed with the patient that there may be a patient responsible charge related to this service. The patient expressed understanding and agreed to proceed.  Subjective:  Patient ID: Whitney [name], female    DOB: [birth year], 64 y.o.   MRN: [id number]  Chief Complaint:  Depression (Meds dose titration)   HPI: Whitney [name] is a 64 y.o. female presenting on 08/02/2024 for Depression (Meds dose titration)   Last seen 07/12/2024. Patient presents for 3-week follow-up for medication dose titration for major depressive disorder and mood disorder. Since last visit, Wellbutrin  ER was discontinued and transitioned to Wellbutrin  XL. Lexapro  was briefly restarted at 5 mg due to side effects following abrupt discontinuation but patient has since weaned herself off and is no longer taking Lexapro . Currently taking Wellbutrin  and Abilify . Discontinued OTC evening primrose oil due to constipation. Reports improved mood and sleeping 7-8 hours nightly with melatonin gummies. Denies  SI/HI.   Relevant past medical, surgical, family, and social history reviewed and updated as indicated.  Allergies and medications reviewed and updated.   History reviewed. No pertinent past medical history.  Past Surgical History:  Procedure Laterality Date   LOBECTOMY     RECTAL PROLAPSE REPAIR  06/07/2021    Social History   Socioeconomic History   Marital status: Single    Spouse name: Not on file   Number of children: Not on file   Years of education: Not on file   Highest education level: 12th grade  Occupational History   Not on file  Tobacco Use   Smoking status: Never   Smokeless tobacco: Not on file  Vaping Use   Vaping status: Never Used  Substance and Sexual Activity   Alcohol use: Never   Drug use: Not on file   Sexual activity: Not on file  Other Topics Concern   Not on file  Social History Narrative   Not on file   Social Drivers of Health   Tobacco Use: Unknown (08/02/2024)   Patient History    Smoking Tobacco Use: Never    Smokeless Tobacco Use: Unknown    Passive Exposure: Not on file  Financial Resource Strain: Low Risk (06/17/2024)   Overall Financial Resource Strain (CARDIA)    Difficulty of Paying Living Expenses: Not hard at all  Food Insecurity: No Food Insecurity (06/17/2024)   Epic    Worried About Radiation Protection Practitioner of Food in the Last Year: Never true    Ran Out of Food in the Last Year: Never true  Transportation Needs: No Transportation Needs (06/17/2024)   Epic  Lack of Transportation (Medical): No    Lack of Transportation (Non-Medical): No  Physical Activity: Inactive (06/17/2024)   Exercise Vital Sign    Days of Exercise per Week: 0 days    Minutes of Exercise per Session: Not on file  Stress: Stress Concern Present (06/17/2024)   [employer] of Occupational Health - Occupational Stress Questionnaire    Feeling of Stress: To some extent  Social Connections: Moderately Isolated (06/17/2024)   Social Connection and  Isolation Panel    Frequency of Communication with Friends and Family: More than three times a week    Frequency of Social Gatherings with Friends and Family: Three times a week    Attends Religious Services: Never    Active Member of Clubs or Organizations: No    Attends Banker Meetings: Not on file    Marital Status: Living with partner  Intimate Partner Violence: Not At Risk (03/17/2022)   Received from [facility]   HITS    Over the last 12 months how often did your partner physically hurt you?: Never    Over the last 12 months how often did your partner insult you or talk down to you?: Never    Over the last 12 months how often did your partner threaten you with physical harm?: Never    Over the last 12 months how often did your partner scream or curse at you?: Never  Depression (PHQ2-9): High Risk (06/21/2024)   Depression (PHQ2-9)    PHQ-2 Score: 15  Alcohol Screen: Low Risk (01/29/2023)   Alcohol Screen    Last Alcohol Screening Score (AUDIT): 0  Housing: Unknown (06/17/2024)   Epic    Unable to Pay for Housing in the Last Year: No    Number of Times Moved in the Last Year: Not on file    Homeless in the Last Year: No  Utilities: Not on file  Health Literacy: Not on file    Outpatient Encounter Medications as of 08/02/2024  Medication Sig   ARIPiprazole  (ABILIFY ) 2 MG tablet Take 1 tablet (2 mg total) by mouth daily.   buPROPion  (WELLBUTRIN  XL) 150 MG 24 hr tablet Take 1 tablet (150 mg total) by mouth daily.   busPIRone  (BUSPAR ) 15 MG tablet Take 1 tablet (15 mg total) by mouth 2 (two) times daily.   escitalopram  (LEXAPRO ) 5 MG tablet Take 1 tablet (5 mg total) by mouth daily.   fluticasone  (FLONASE ) 50 MCG/ACT nasal spray SPRAY 2 SPRAYS INTO EACH NOSTRIL EVERY DAY   oxybutynin  (DITROPAN -XL) 10 MG 24 hr tablet TAKE 1 TABLET BY MOUTH EVERY DAY   No facility-administered encounter medications on file as of 08/02/2024.    Allergies[1]  Review of Systems   Constitutional:  Negative for chills and fever.  HENT:  Negative for congestion and sinus pressure.   Respiratory:  Negative for chest tightness, shortness of breath and wheezing.   Gastrointestinal:  Positive for constipation.       Resolve since stopping evening premarin oil  Skin:  Negative for color change, pallor and rash.  Neurological:  Negative for dizziness, numbness and headaches.  Psychiatric/Behavioral:  Negative for agitation, behavioral problems, decreased concentration and hallucinations.     Observations/Objective: No vital signs or physical exam, this was a virtual health encounter.  Pt alert and oriented, answers all questions appropriately, and able to speak in full sentences.  Physical Exam Constitutional:      General: She is not in acute distress. HENT:  Head: Normocephalic and atraumatic.  Eyes:     General: No scleral icterus.    Extraocular Movements: Extraocular movements intact.     Conjunctiva/sclera: Conjunctivae normal.     Pupils: Pupils are equal, round, and reactive to light.  Pulmonary:     Effort: Pulmonary effort is normal.  Neurological:     Mental Status: She is alert and oriented to person, place, and time.  Psychiatric:        Attention and Perception: Attention and perception normal. She is attentive.        Mood and Affect: Mood normal.        Speech: Speech normal.        Behavior: Behavior is not agitated or aggressive. Behavior is cooperative.        Thought Content: Thought content normal. Thought content is not paranoid or delusional. Thought content does not include homicidal ideation. Thought content does not include homicidal plan.        Cognition and Memory: Cognition and memory normal.        Judgment: Judgment normal.       Assessment and Plan: Ariaunna was seen today for depression.  Diagnoses and all orders for this visit:  Mood swings  Moderate episode of recurrent major depressive disorder (HCC)  Other  insomnia    Please is a 64 year old Caucasian female seen today via telehealth for chronic disease management, no acute distress  Assessment & Plan:  Major Depressive Disorder -Improving -Continue Wellbutrin  XL -Monitor mood and depressive symptoms  Mood Disorder -Stable -Continue Abilify  -Monitor for mood instability and medication side effects  Insomnia -Improved -Continue melatonin gummies as needed -Reinforce sleep hygiene    Clinical References  Managing Depression, Adult Depression is a mental health condition that affects your thoughts, feelings, and actions. Being diagnosed with depression can bring you relief if you did not know why you have felt or behaved a certain way. It could also leave you feeling overwhelmed. Finding ways to manage your symptoms can help you feel more positive about your future. How to manage lifestyle changes Being depressed is difficult. Depression can increase the level of everyday stress. Stress can make depression symptoms worse. You may believe your symptoms cannot be managed or will never improve. However, there are many things you can try to help manage your symptoms. There is hope. Managing stress  Stress is your body's reaction to life changes and events, both good and bad. Stress can add to your feelings of depression. Learning to manage your stress can help lessen your feelings of depression. Try some of the following approaches to reducing your stress (stress reduction techniques): Listen to music that you enjoy and that inspires you. Try using a meditation app or take a meditation class. Develop a practice that helps you connect with your spiritual self. Walk in nature, pray, or go to a place of worship. Practice deep breathing. To do this, inhale slowly through your nose. Pause at the top of your inhale for a few seconds and then exhale slowly, letting yourself relax. Repeat this three or four times. Practice yoga to help relax and  work your muscles. Choose a stress reduction technique that works for you. These techniques take time and practice to develop. Set aside 5-15 minutes a day to do them. Therapists can offer training in these techniques. Do these things to help manage stress: Keep a journal. Know your limits. Set healthy boundaries for yourself and others, such as saying no when you  think something is too much. Pay attention to how you react to certain situations. You may not be able to control everything, but you can change your reaction. Add humor to your life by watching funny movies or shows. Make time for activities that you enjoy and that relax you. Spend less time using electronics, especially at night before bed. The light from screens can make your brain think it is time to get up rather than go to bed.   Medicines Medicines, such as antidepressants, are often a part of treatment for depression. Talk with your pharmacist or health care provider about all the medicines, supplements, and herbal products that you take, their possible side effects, and what medicines and other products are safe to take together. Make sure to report any side effects you may have to your health care provider. Relationships Your health care provider may suggest family therapy, couples therapy, or individual therapy as part of your treatment. How to recognize changes Everyone responds differently to treatment for depression. As you recover from depression, you may start to: Have more interest in doing activities. Feel more hopeful. Have more energy. Eat a more regular amount of food. Have better mental focus. It is important to recognize if your depression is not getting better or is getting worse. The symptoms you had in the beginning may return, such as: Feeling tired. Eating too much or too little. Sleeping too much or too little. Feeling restless, agitated, or hopeless. Trouble focusing or making decisions. Having  unexplained aches and pains. Feeling irritable, angry, or aggressive. If you or your family members notice these symptoms coming back, let your health care provider know right away. Follow these instructions at home: Activity Try to get some form of exercise each day, such as walking. Try yoga, mindfulness, or other stress reduction techniques. Participate in group activities if you are able. Lifestyle Get enough sleep. Cut down on or stop using caffeine, tobacco, alcohol, and any other harmful substances. Eat a healthy diet that includes plenty of vegetables, fruits, whole grains, low-fat dairy products, and lean protein. Limit foods that are high in solid fats, added sugar, or salt (sodium). General instructions Take over-the-counter and prescription medicines only as told by your health care provider. Keep all follow-up visits. It is important for your health care provider to check on your mood, behavior, and medicines. Your health care provider may need to make changes to your treatment. Where to find support Talking to others  Friends and family members can be sources of support and guidance. Talk to trusted friends or family members about your condition. Explain your symptoms and let them know that you are working with a health care provider to treat your depression. Tell friends and family how they can help. Finances Find mental health providers that fit with your financial situation. Talk with your health care provider if you are worried about access to food, housing, or medicine. Call your insurance company to learn about your co-pays and prescription plan. Where to find more information You can find support in your area from: Anxiety and Depression Association of America (ADAA): adaa.org Mental Health America: mentalhealthamerica.net [employer] on Mental Illness: nami.org Contact a health care provider if: You stop taking your antidepressant medicines, and you have any  of these symptoms: Nausea. Headache. Light-headedness. Chills and body aches. Not being able to sleep (insomnia). You or your friends and family think your depression is getting worse. Get help right away if: You have thoughts of hurting  yourself or others. Get help right away if you feel like you may hurt yourself or others, or have thoughts about taking your own life. Go to your nearest emergency room or: Call 911. Call the National Suicide Prevention Lifeline at 7147833531 or 988. This is open 24 hours a day. Text the Crisis Text Line at 2287739141. This information is not intended to replace advice given to you by your health care provider. Make sure you discuss any questions you have with your health care provider. Document Revised: 10/30/2021 Document Reviewed: 10/30/2021 Elsevier Patient Education  2024 [employer]. Feeling Very Unhappy, Dissatisfied, or Uneasy (Dysphoria): What to Do Dysphoria is when someone has very strong feelings of uneasiness, unhappiness, or dissatisfaction. It can also involve feeling sad, worried, or easily annoyed. A person may have symptoms of dysphoria because of many reasons. For example, it may be caused by stressful events or situations, even when the event or situation is expected or known.  If symptoms of dysphoria last longer than two weeks, it's important to talk to a health care professional because the symptoms may be caused by a mental health condition, such as an adjustment disorder, depression, or anxiety disorder. Follow these instructions at home:  Tips and recommendations The following tips may help with managing feelings of dysphoria: Take care of yourself. Eat a healthy diet. Exercise regularly. For example, aim for about 20 minutes of physical activity every day. Make time to do things that you enjoy. Get enough sleep. Doing certain things can help with getting enough sleep. You can: Turn off screens an hour or so before you get  ready for sleep. Have a comfortable temperature in the room for sleeping. Do your best to have a regular time for going to bed and waking up. Avoid using substances to cope, including alcohol, nicotine, or other drugs. Talk about your feelings with people you trust, like loved ones, family, or friends. Learn about things that can help with coping and reducing stress, such as yoga and meditation. Try to do them often. General instructions Take your medicines only as told. Check with your provider before taking any herbs or supplements. Where to find support Your provider. Loved ones, family, or trusted friends. Contact the NAMI HelpLine. Available Monday- Friday, 10 a.m. - 10 p.m. ET. Call 1-800-950-NAMI 640 070 5729). Text HelpLine to 423-036-1126. Email: helpline@nami .org. Where to find more information For information about living with mental health conditions visit: www.nami.org Contact a health care provider if: Your symptoms are not getting better or have lasted longer than 2 weeks. Your symptoms are getting worse. You start to have symptoms you haven't had before, and the symptoms worry you. Get help right away if: You feel like you may hurt yourself or others. You have thoughts about taking your own life. You have other thoughts or feelings that worry you. These symptoms may be an emergency. Take one of these steps right away: Go to your nearest emergency room. Call 911. Contact The 988 Suicide & Crisis Lifeline (24 hours a day, 7 days a week, free and confidential): Call or text 988. Chat online at chat.newsactor.se. For Veterans and their loved ones, contact the [employer]: Call 988 and press 1. Text 514-363-4425. Chat online at veteranscrisisline.net This information is not intended to replace advice given to you by your health care provider. Make sure you discuss any questions you have with your health care provider. Document Revised: 07/17/2023 Document Reviewed:  07/17/2023 Elsevier Patient Education  2025 [employer]. Insomnia  Insomnia is a sleep disorder that makes it difficult to fall asleep or stay asleep. Insomnia can cause fatigue, low energy, difficulty concentrating, mood swings, and poor performance at work or school. There are three different ways to classify insomnia: Difficulty falling asleep. Difficulty staying asleep. Waking up too early in the morning. Any type of insomnia can be long-term (chronic) or short-term (acute). Both are common. Short-term insomnia usually lasts for 3 months or less. Chronic insomnia occurs at least three times a week for longer than 3 months. What are the causes? Insomnia may be caused by another condition, situation, or substance, such as: Having certain mental health conditions, such as anxiety and depression. Using caffeine, alcohol, tobacco, or drugs. Having gastrointestinal conditions, such as gastroesophageal reflux disease (GERD). Having certain medical conditions. These include: Asthma. Alzheimer's disease. Stroke. Chronic pain. An overactive thyroid  gland (hyperthyroidism). Other sleep disorders, such as restless legs syndrome and sleep apnea. Menopause. Sometimes, the cause of insomnia may not be known. What increases the risk? Risk factors for insomnia include: Gender. Females are affected more often than males. Age. Insomnia is more common as people get older. Stress and certain medical and mental health conditions. Lack of exercise. Having an irregular work schedule. This may include working night shifts and traveling between different time zones. What are the signs or symptoms? If you have insomnia, the main symptom is having trouble falling asleep or having trouble staying asleep. This may lead to other symptoms, such as: Feeling tired or having low energy. Feeling nervous about going to sleep. Not feeling rested in the morning. Having trouble concentrating. Feeling irritable,  anxious, or depressed. How is this diagnosed? This condition may be diagnosed based on: Your symptoms and medical history. Your health care provider may ask about: Your sleep habits. Any medical conditions you have. Your mental health. A physical exam. How is this treated? Treatment for insomnia depends on the cause. Treatment may focus on treating an underlying condition that is causing the insomnia. Treatment may also include: Medicines to help you sleep. Counseling or therapy. Lifestyle adjustments to help you sleep better. Follow these instructions at home: Eating and drinking  Limit or avoid alcohol, caffeinated beverages, and products that contain nicotine and tobacco, especially close to bedtime. These can disrupt your sleep. Do not eat a large meal or eat spicy foods right before bedtime. This can lead to digestive discomfort that can make it hard for you to sleep. Sleep habits  Keep a sleep diary to help you and your health care provider figure out what could be causing your insomnia. Write down: When you sleep. When you wake up during the night. How well you sleep and how rested you feel the next day. Any side effects of medicines you are taking. What you eat and drink. Make your bedroom a dark, comfortable place where it is easy to fall asleep. Put up shades or blackout curtains to block light from outside. Use a white noise machine to block noise. Keep the temperature cool. Limit screen use before bedtime. This includes: Not watching TV. Not using your smartphone, tablet, or computer. Stick to a routine that includes going to bed and waking up at the same times every day and night. This can help you fall asleep faster. Consider making a quiet activity, such as reading, part of your nighttime routine. Try to avoid taking naps during the day so that you sleep better at night. Get out of bed if you are still awake after 15  minutes of trying to sleep. Keep the lights down,  but try reading or doing a quiet activity. When you feel sleepy, go back to bed. General instructions Take over-the-counter and prescription medicines only as told by your health care provider. Exercise regularly as told by your health care provider. However, avoid exercising in the hours right before bedtime. Use relaxation techniques to manage stress. Ask your health care provider to suggest some techniques that may work well for you. These may include: Breathing exercises. Routines to release muscle tension. Visualizing peaceful scenes. Make sure that you drive carefully. Do not drive if you feel very sleepy. Keep all follow-up visits. This is important. Contact a health care provider if: You are tired throughout the day. You have trouble in your daily routine due to sleepiness. You continue to have sleep problems, or your sleep problems get worse. Get help right away if: You have thoughts about hurting yourself or someone else. Get help right away if you feel like you may hurt yourself or others, or have thoughts about taking your own life. Go to your nearest emergency room or: Call 911. Call the National Suicide Prevention Lifeline at (520)665-9193 or 988. This is open 24 hours a day. Text the Crisis Text Line at 509-390-4845. Summary Insomnia is a sleep disorder that makes it difficult to fall asleep or stay asleep. Insomnia can be long-term (chronic) or short-term (acute). Treatment for insomnia depends on the cause. Treatment may focus on treating an underlying condition that is causing the insomnia. Keep a sleep diary to help you and your health care provider figure out what could be causing your insomnia. This information is not intended to replace advice given to you by your health care provider. Make sure you discuss any questions you have with your health care provider. Document Revised: 06/04/2021 Document Reviewed: 06/04/2021 Elsevier Patient Education  2024 Else[employer]  Inc[employer] Follow Up Instructions: Return in about 6 weeks (around 09/13/2024) for Chronic disease management.    I discussed the assessment and treatment plan with the patient. The patient was provided an opportunity to ask questions and all were answered. The patient agreed with the plan and demonstrated an understanding of the instructions.   The patient was advised to call back or seek an in-person evaluation if the symptoms worsen or if the condition fails to improve as anticipated.  The above assessment and management plan was discussed with the patient. The patient verbalized understanding of and has agreed to the management plan. Patient is aware to call the clinic if they develop any new symptoms or if symptoms persist or worsen. Patient is aware when to return to the clinic for a follow-up visit. Patient educated on when it is appropriate to go to the emergency department.    I provided 15 minutes of time during this video encounter.   Ovidio Steele [place] Thompson, DNP [facility] [address] [place], [place] [address] [phone] 08/02/2024      [1] No Known Allergies  "

## 2024-08-04 ENCOUNTER — Other Ambulatory Visit: Payer: Self-pay | Admitting: Nurse Practitioner

## 2024-08-04 DIAGNOSIS — R42 Dizziness and giddiness: Secondary | ICD-10-CM

## 2024-08-04 MED ORDER — MECLIZINE HCL 12.5 MG PO TABS: 12.5000 mg | ORAL_TABLET | Freq: Three times a day (TID) | ORAL | 1 refills | Status: AC | PRN

## 2024-08-10 ENCOUNTER — Other Ambulatory Visit: Payer: Self-pay | Admitting: Nurse Practitioner

## 2024-08-10 DIAGNOSIS — B9689 Other specified bacterial agents as the cause of diseases classified elsewhere: Secondary | ICD-10-CM
